# Patient Record
Sex: Female | Born: 1980 | Hispanic: Yes | Marital: Single | Smoking: Never smoker
Health system: Southern US, Community
[De-identification: ages and names within clinical notes are randomized; demographics above are authoritative.]

## PROBLEM LIST (undated history)

## (undated) DIAGNOSIS — Z789 Other specified health status: Secondary | ICD-10-CM

## (undated) HISTORY — PX: NO PAST SURGERIES: SHX2092

---

## 2008-09-23 ENCOUNTER — Inpatient Hospital Stay (HOSPITAL_COMMUNITY): Admission: AD | Admit: 2008-09-23 | Discharge: 2008-09-23 | Payer: Self-pay | Admitting: Family Medicine

## 2011-09-14 LAB — CBC
HCT: 39.5
Platelets: 318
WBC: 11.6 — ABNORMAL HIGH

## 2011-09-14 LAB — URINALYSIS, ROUTINE W REFLEX MICROSCOPIC
Bilirubin Urine: NEGATIVE
Glucose, UA: NEGATIVE
Ketones, ur: NEGATIVE
Nitrite: NEGATIVE
Urobilinogen, UA: 0.2
pH: 6.5

## 2011-09-14 LAB — URINE MICROSCOPIC-ADD ON

## 2011-09-14 LAB — WET PREP, GENITAL: Trich, Wet Prep: NONE SEEN

## 2011-09-14 LAB — URINE CULTURE

## 2011-09-14 LAB — GC/CHLAMYDIA PROBE AMP, GENITAL: GC Probe Amp, Genital: NEGATIVE

## 2015-09-23 ENCOUNTER — Inpatient Hospital Stay (HOSPITAL_COMMUNITY)
Admission: AD | Admit: 2015-09-23 | Discharge: 2015-09-23 | Disposition: A | Discharge status: Home / Self Care | Payer: Self-pay | Source: Ambulatory Visit | Attending: Family Medicine | Admitting: Family Medicine

## 2015-09-23 ENCOUNTER — Inpatient Hospital Stay (HOSPITAL_COMMUNITY): Payer: Self-pay

## 2015-09-23 ENCOUNTER — Encounter (HOSPITAL_COMMUNITY): Payer: Self-pay | Admitting: *Deleted

## 2015-09-23 DIAGNOSIS — O26891 Other specified pregnancy related conditions, first trimester: Secondary | ICD-10-CM | POA: Insufficient documentation

## 2015-09-23 DIAGNOSIS — O4691 Antepartum hemorrhage, unspecified, first trimester: Secondary | ICD-10-CM

## 2015-09-23 DIAGNOSIS — O209 Hemorrhage in early pregnancy, unspecified: Secondary | ICD-10-CM

## 2015-09-23 DIAGNOSIS — Z3A01 Less than 8 weeks gestation of pregnancy: Secondary | ICD-10-CM | POA: Insufficient documentation

## 2015-09-23 DIAGNOSIS — N939 Abnormal uterine and vaginal bleeding, unspecified: Secondary | ICD-10-CM | POA: Insufficient documentation

## 2015-09-23 HISTORY — DX: Other specified health status: Z78.9

## 2015-09-23 LAB — CBC
HEMATOCRIT: 43.3 % (ref 36.0–46.0)
Hemoglobin: 15.7 g/dL — ABNORMAL HIGH (ref 12.0–15.0)
MCH: 31.4 pg (ref 26.0–34.0)
MCHC: 36.3 g/dL — ABNORMAL HIGH (ref 30.0–36.0)
MCV: 86.6 fL (ref 78.0–100.0)
Platelets: 326 10*3/uL (ref 150–400)
RBC: 5 MIL/uL (ref 3.87–5.11)
RDW: 13.1 % (ref 11.5–15.5)
WBC: 12.4 10*3/uL — AB (ref 4.0–10.5)

## 2015-09-23 LAB — URINALYSIS, ROUTINE W REFLEX MICROSCOPIC
Bilirubin Urine: NEGATIVE
GLUCOSE, UA: NEGATIVE mg/dL
KETONES UR: NEGATIVE mg/dL
LEUKOCYTES UA: NEGATIVE
NITRITE: NEGATIVE
PROTEIN: NEGATIVE mg/dL
Specific Gravity, Urine: 1.01 (ref 1.005–1.030)
Urobilinogen, UA: 0.2 mg/dL (ref 0.0–1.0)
pH: 7 (ref 5.0–8.0)

## 2015-09-23 LAB — WET PREP, GENITAL
Clue Cells Wet Prep HPF POC: NONE SEEN
TRICH WET PREP: NONE SEEN
YEAST WET PREP: NONE SEEN

## 2015-09-23 LAB — URINE MICROSCOPIC-ADD ON

## 2015-09-23 LAB — HCG, QUANTITATIVE, PREGNANCY: hCG, Beta Chain, Quant, S: 281 m[IU]/mL — ABNORMAL HIGH (ref <5)

## 2015-09-23 LAB — POCT PREGNANCY, URINE: Preg Test, Ur: POSITIVE — AB

## 2015-09-23 NOTE — MAU Provider Note (Signed)
History     CSN: [id number]  Arrival date and time: 09/23/15 1721   First Provider Initiated Contact with Patient 09/23/15 1900      Chief Complaint  Patient presents with  . Vaginal Bleeding  . Possible Pregnancy   This is a 34 y.o. female at [redacted]w[redacted]d who presents with c/o bleeding in early pregnancy. Had first positive test in September. Started bleeding at 1545 today, heavy. States has a little cramping.    Vaginal Bleeding The patient's primary symptoms include vaginal bleeding. The patient's pertinent negatives include no genital itching, genital lesions or genital odor. This is a new problem. The current episode started today. The problem occurs intermittently. The pain is mild. She is pregnant. Associated symptoms include abdominal pain. Pertinent negatives include no chills, constipation, diarrhea, fever, headaches, nausea or vomiting. The vaginal discharge was bloody. The vaginal bleeding is lighter than menses. She has not been passing clots. She has not been passing tissue. Nothing aggravates the symptoms. She has tried nothing for the symptoms. No, her partner does not have an STD.  RN Note: Pos preg test in Sept and Oct, all were preg. Started bleeding at 1545, has soaked a pad  OB History    Gravida Para Term Preterm AB TAB SAB Ectopic Multiple Living   Past Medical History  Diagnosis Date  . Medical history non-contributory     Past Surgical History  Procedure Laterality Date  . No past surgeries      Family History  Problem Relation Age of Onset  . Heart disease Brother     Social History  Substance Use Topics  . Smoking status: Never Smoker   . Smokeless tobacco: None  . Alcohol Use: No    Allergies: No Known Allergies  Prescriptions prior to admission  Medication Sig Dispense Refill Last Dose  . Prenatal Vit-Fe Fumarate-FA (PRENATAL MULTIVITAMIN) TABS tablet Take 1 tablet by mouth daily at 12 noon.   09/23/2015 at Unknown time    Medical, Surgical, Family and Social histories reviewed and are listed above.  Medications and allergies reviewed.   Review of Systems  Constitutional: Negative for fever, chills and malaise/fatigue.  Gastrointestinal: Positive for abdominal pain. Negative for nausea, vomiting, diarrhea and constipation.  Genitourinary: Positive for vaginal bleeding.       Vaginal bleeding   Neurological: Negative for dizziness, weakness and headaches.  Other systems negative  Physical Exam   Blood pressure 135/85, pulse 91, temperature 98.1 F (36.7 C), temperature source Oral, resp. rate 18, height 4' 10.5" (1.486 m), weight 66.044 kg (145 lb 9.6 oz), last menstrual period 08/06/2015.  Physical Exam  Constitutional: She is oriented to person, place, and time. She appears well-developed and well-nourished. No distress.  HENT:  Head: Normocephalic.  Cardiovascular: Normal rate and regular rhythm.   Respiratory: Effort normal and breath sounds normal. No respiratory distress.  GI: Soft. She exhibits no distension and no mass. There is no tenderness. There is no rebound and no guarding.  Genitourinary: Vaginal discharge (small amount of blood) found.  Cervix long and closed Uterus small and firm No adnexal tenderness   Musculoskeletal: Normal range of motion.  Neurological: She is alert and oriented to person, place, and time.  Skin: Skin is warm and dry.  Psychiatric: She has a normal mood and affect.    MAU Course  Procedures  MDM This bleeding could represent a normal pregnancy with  bleeding, spontaneous abortion or even an ectopic which can be life-threatening.   Cultures were done to rule out pelvic infection Blood drawn for Quant HCG, CBC, ABO/Rh   Results for orders placed or performed during the hospital encounter of 09/23/15 (from the past 72 hour(s))  GC/Chlamydia probe amp ([place])not at [facility]     Status: None   Collection Time: 09/23/15 12:00 AM  Result Value Ref Range    Chlamydia Negative     Comment: Normal Reference Range - Negative   Neisseria gonorrhea Negative     Comment: Normal Reference Range - Negative  Urinalysis, Routine w reflex microscopic (not at [facility])     Status: Abnormal   Collection Time: 09/23/15  5:45 PM  Result Value Ref Range   Color, Urine YELLOW YELLOW   APPearance CLEAR CLEAR   Specific Gravity, Urine 1.010 1.005 - 1.030   pH 7.0 5.0 - 8.0   Glucose, UA NEGATIVE NEGATIVE mg/dL   Hgb urine dipstick LARGE (A) NEGATIVE   Bilirubin Urine NEGATIVE NEGATIVE   Ketones, ur NEGATIVE NEGATIVE mg/dL   Protein, ur NEGATIVE NEGATIVE mg/dL   Urobilinogen, UA 0.2 0.0 - 1.0 mg/dL   Nitrite NEGATIVE NEGATIVE   Leukocytes, UA NEGATIVE NEGATIVE  Urine microscopic-add on     Status: None   Collection Time: 09/23/15  5:45 PM  Result Value Ref Range   Squamous Epithelial / LPF RARE RARE   RBC / HPF 0-2 <3 RBC/hpf  Pregnancy, urine POC     Status: Abnormal   Collection Time: 09/23/15  5:55 PM  Result Value Ref Range   Preg Test, Ur POSITIVE (A) NEGATIVE    Comment:        THE SENSITIVITY OF THIS METHODOLOGY IS >24 mIU/mL   hCG, quantitative, pregnancy     Status: Abnormal   Collection Time: 09/23/15  6:16 PM  Result Value Ref Range   hCG, Beta Chain, Quant, S 281 (H) <5 mIU/mL    Comment:          GEST. AGE      CONC.  (mIU/mL)   <=1 WEEK        5 - 50     2 WEEKS       50 - 500     3 WEEKS       100 - 10,000     4 WEEKS     1,000 - 30,000     5 WEEKS     3,500 - 115,000   6-8 WEEKS     12,000 - 270,000    12 WEEKS     15,000 - 220,000        FEMALE AND NON-PREGNANT FEMALE:     LESS THAN 5 mIU/mL   CBC     Status: Abnormal   Collection Time: 09/23/15  6:16 PM  Result Value Ref Range   WBC 12.4 (H) 4.0 - 10.5 K/uL   RBC 5.00 3.87 - 5.11 MIL/uL   Hemoglobin 15.7 (H) 12.0 - 15.0 g/dL   HCT 16.1 09.6 - 04.5 %   MCV 86.6 78.0 - 100.0 fL   MCH 31.4 26.0 - 34.0 pg   MCHC 36.3 (H) 30.0 - 36.0 g/dL   RDW 40.9 81.1 - 91.4 %    Platelets 326 150 - 400 K/uL  HIV antibody (routine testing) (NOT for [facility])     Status: None   Collection Time: 09/23/15  6:16 PM  Result Value Ref Range   HIV  Screen 4th Generation wRfx Non Reactive Non Reactive    Comment: (NOTE) Performed At: [facility] [address] [place], [place] [id number] Mila Homer MD WU:[id number]   Wet prep, genital     Status: Abnormal   Collection Time: 09/23/15  7:20 PM  Result Value Ref Range   Yeast Wet Prep HPF POC NONE SEEN NONE SEEN   Trich, Wet Prep NONE SEEN NONE SEEN   Clue Cells Wet Prep HPF POC NONE SEEN NONE SEEN   WBC, Wet Prep HPF POC MODERATE (A) NONE SEEN    Comment: FEW BACTERIA SEEN   US showed small sac, possibly gestational with no yolk sac or fetal pole. Cannot rule out ectopic yet  Assessment and Plan  A:  Pregnancy at [redacted]w[redacted]d       Pregnancy of unknown location       Cannot rule out ectopic vs first trimester bleeding vs SAB  P:  Discussed findings and results       Recommend return in 48 hours to check HCG level      Ectopic precautions      Bleeding precautions  Mary Stevenson 09/23/2015, 7:00 PM

## 2015-09-23 NOTE — Discharge Instructions (Signed)
Hemorragia vaginal durante el embarazo (primer trimestre)  (Vaginal Bleeding During Pregnancy, First Trimester)  Durante los primeros meses de embarazo, es común tener una pequeña hemorragia vaginal (manchas). A veces, la hemorragia es normal y no representa un problema, pero en algunas ocasiones es un síntoma de algo grave. Asegúrese de decirle a su médico de inmediato si tiene algún tipo de hemorragia vaginal.  CUIDADOS EN EL HOGAR  · Controle su afección para ver si hay cambios.  · Siga las indicaciones de su médico con respecto al grado de actividad que puede tener.  · Si debe hacer reposo en cama:    Es posible que deba quedarse en cama y levantarse únicamente para ir al baño.    Quizás le permitan hacer algunas actividades.    Si es necesario, planifique que alguien la ayude.  · Escriba:    La cantidad de toallas higiénicas que usa cada día.    La frecuencia con la que se cambia las toallas higiénicas.    Indique que tan empapados (saturados) están.  · No use tampones.  · No se haga duchas vaginales.  · No tenga relaciones sexuales ni orgasmos hasta que el médico la autorice.  · Si elimina tejido por la vagina, guárdelo para mostrárselo al médico.  · Tome los medicamentos solamente como se lo haya indicado el médico.  · No tome aspirina, ya que puede causar hemorragias.  · Concurra a todas las visitas de control como se lo haya indicado el médico.  SOLICITE AYUDA SI:   · Tiene una hemorragia vaginal.  · Tiene cólicos.  · Tiene dolores de parto.  · Tiene fiebre que no desaparece después de tomar medicamentos.  SOLICITE AYUDA DE INMEDIATO SI:   · Siente cólicos muy intensos en la espalda o en el vientre (abdomen).  · Elimina coágulos grandes o tejido por la vagina.  · Tiene más hemorragia.  · Se siente débil o que va a desvanecerse.  · Pierde el conocimiento (se desmaya).  · Tiene escalofríos.  · Tiene una pérdida importante o sale líquido a borbotones por la vagina.  · Se desmaya mientras defeca.  ASEGÚRESE DE  QUE:  · Comprende estas instrucciones.  · Controlará su afección.  · Recibirá ayuda de inmediato si no mejora o si empeora.     Esta información no tiene como fin reemplazar el consejo del médico. Asegúrese de hacerle al médico cualquier pregunta que tenga.     Document Released: 04/15/2014  Elsevier Interactive Patient Education ©2016 Elsevier Inc.

## 2015-09-23 NOTE — MAU Note (Signed)
Pos preg test in Sept and Oct, all were preg. Started bleeding at 1545, has soaked a pad.

## 2015-09-24 ENCOUNTER — Inpatient Hospital Stay (HOSPITAL_COMMUNITY)
Admission: AD | Admit: 2015-09-24 | Discharge: 2015-09-24 | Disposition: A | Discharge status: Home / Self Care | Payer: Self-pay | Source: Ambulatory Visit | Attending: Obstetrics and Gynecology | Admitting: Obstetrics and Gynecology

## 2015-09-24 ENCOUNTER — Inpatient Hospital Stay (HOSPITAL_COMMUNITY): Payer: Self-pay

## 2015-09-24 ENCOUNTER — Encounter (HOSPITAL_COMMUNITY): Payer: Self-pay | Admitting: *Deleted

## 2015-09-24 DIAGNOSIS — Z3A01 Less than 8 weeks gestation of pregnancy: Secondary | ICD-10-CM | POA: Insufficient documentation

## 2015-09-24 DIAGNOSIS — O209 Hemorrhage in early pregnancy, unspecified: Secondary | ICD-10-CM

## 2015-09-24 DIAGNOSIS — O039 Complete or unspecified spontaneous abortion without complication: Secondary | ICD-10-CM | POA: Insufficient documentation

## 2015-09-24 DIAGNOSIS — O4691 Antepartum hemorrhage, unspecified, first trimester: Secondary | ICD-10-CM | POA: Insufficient documentation

## 2015-09-24 LAB — CBC
HCT: 42.2 % (ref 36.0–46.0)
Hemoglobin: 15.3 g/dL — ABNORMAL HIGH (ref 12.0–15.0)
MCH: 31.6 pg (ref 26.0–34.0)
MCHC: 36.3 g/dL — AB (ref 30.0–36.0)
MCV: 87.2 fL (ref 78.0–100.0)
PLATELETS: 278 10*3/uL (ref 150–400)
RBC: 4.84 MIL/uL (ref 3.87–5.11)
RDW: 13.1 % (ref 11.5–15.5)
WBC: 10.5 10*3/uL (ref 4.0–10.5)

## 2015-09-24 LAB — GC/CHLAMYDIA PROBE AMP (~~LOC~~) NOT AT ARMC
Chlamydia: NEGATIVE
NEISSERIA GONORRHEA: NEGATIVE

## 2015-09-24 LAB — HCG, QUANTITATIVE, PREGNANCY: HCG, BETA CHAIN, QUANT, S: 146 m[IU]/mL — AB (ref <5)

## 2015-09-24 LAB — ABO/RH: ABO/RH(D): O POS

## 2015-09-24 LAB — HIV ANTIBODY (ROUTINE TESTING W REFLEX): HIV SCREEN 4TH GENERATION: NONREACTIVE

## 2015-09-24 MED ORDER — HYDROCODONE-ACETAMINOPHEN 5-325 MG PO TABS
1.0000 | ORAL_TABLET | Freq: Once | ORAL | Status: AC
  Administered 2015-09-24: 1 via ORAL
  Filled 2015-09-24: qty 1

## 2015-09-24 NOTE — MAU Note (Signed)
Pt seen in MAU yesterday for bleeding,  Bleeding is much heavier this morning, states she is changing a pad every 5 minutes.  Also lower abd & lower back pain.

## 2015-09-24 NOTE — MAU Provider Note (Signed)
History     CSN: [id number]  Arrival date and time: 09/24/15 4098   First Provider Initiated Contact with Patient 09/24/15 [phone]      Chief Complaint  Patient presents with  . Vaginal Bleeding  . Abdominal Pain   HPI Mary Stevenson 34 y.o. G4P3000  presents for increased vaginal blleding.  It started yesterday and overnight became much heavier.  She was not doing anything to aggravate it.  She has noted no alleviating factors.  She has associated abdominal pain.  She has not taken any medications.  She notes pain is 9/10.  She had 2 big clots overnight and one smaller one this am.  She has a headache.  Denies weakness, syncope, fever, CP, SOB, nausea, vomiting.   OB History    Gravida Para Term Preterm AB TAB SAB Ectopic Multiple Living   Past Medical History  Diagnosis Date  . Medical history non-contributory     Past Surgical History  Procedure Laterality Date  . No past surgeries      Family History  Problem Relation Age of Onset  . Heart disease Brother     Social History  Substance Use Topics  . Smoking status: Never Smoker   . Smokeless tobacco: None  . Alcohol Use: No    Allergies: No Known Allergies  Prescriptions prior to admission  Medication Sig Dispense Refill Last Dose  . Prenatal Vit-Fe Fumarate-FA (PRENATAL MULTIVITAMIN) TABS tablet Take 1 tablet by mouth daily at 12 noon.   09/23/2015 at Unknown time    ROS Pertinent ROS in HPI.  All other systems are negative.   Physical Exam   Blood pressure 125/83, pulse 92, temperature 98.2 F (36.8 C), temperature source Oral, resp. rate 18, last menstrual period 08/06/2015.  Physical Exam  Constitutional: She is oriented to person, place, and time. She appears well-developed and well-nourished. No distress.  HENT:  Head: Normocephalic and atraumatic.  Eyes: EOM are normal.  Neck: Normal range of motion.  Cardiovascular: Normal rate and normal heart sounds.   Respiratory:  Breath sounds normal. No respiratory distress.  GI: Soft. She exhibits no distension. There is no tenderness.  Genitourinary:  Mod amt of dark red blood in vault.  Small amt of products present in os.  Removed and sent for path.   Cervix open finger tip.  No tenderness appreciated  Musculoskeletal: Normal range of motion.  Neurological: She is alert and oriented to person, place, and time.  Skin: Skin is warm and dry.  Psychiatric: She has a normal mood and affect.   Results for orders placed or performed during the hospital encounter of 09/24/15 (from the past 24 hour(s))  ABO/Rh     Status: None   Collection Time: 09/24/15  8:04 AM  Result Value Ref Range   ABO/RH(D) O POS   CBC     Status: Abnormal   Collection Time: 09/24/15  8:05 AM  Result Value Ref Range   WBC 10.5 4.0 - 10.5 K/uL   RBC 4.84 3.87 - 5.11 MIL/uL   Hemoglobin 15.3 (H) 12.0 - 15.0 g/dL   HCT 47.8 29.5 - 62.1 %   MCV 87.2 78.0 - 100.0 fL   MCH 31.6 26.0 - 34.0 pg   MCHC 36.3 (H) 30.0 - 36.0 g/dL   RDW 30.8 65.7 - 84.6 %   Platelets 278 150 - 400 K/uL  hCG, quantitative, pregnancy  Status: Abnormal   Collection Time: 09/24/15  8:05 AM  Result Value Ref Range   hCG, Beta Chain, Quant, S 146 (H) <5 mIU/mL   [place] Ob Comp Less 14 Wks  09/23/2015  CLINICAL DATA:  34 year old pregnant female with vaginal bleeding. Estimated gestational age of [redacted] weeks 6 days by LMP. Beta HCG of 281. EXAM: OBSTETRIC <14 WK US AND TRANSVAGINAL OB US TECHNIQUE: Both transabdominal and transvaginal ultrasound examinations were performed for complete evaluation of the gestation as well as the maternal uterus, adnexal regions, and pelvic cul-de-sac. Transvaginal technique was performed to assess early pregnancy. COMPARISON:  None. FINDINGS: Intrauterine gestational sac: A small cystic structure within the uterus is identified with mean diameter of 2.9 mm. This may represent an early gestational sac. Yolk sac:  Not visualized Embryo:  Not  visualized Cardiac Activity: Not visualized MSD: 2.9  mm   5 w   0  d                US EDC: 05/25/2016 Maternal uterus/adnexae: There is no evidence of subchorionic hemorrhage. The ovaries bilaterally are unremarkable. There is no evidence of free fluid or adnexal mass. IMPRESSION: Probable early intrauterine gestational sac, but no yolk sac, fetal pole, or cardiac activity yet visualized. Estimated gestational age of [redacted] weeks 0 days if this represents an early intrauterine gestational sac. Recommend follow-up quantitative B-HCG levels and follow-up US in 14 days to confirm and assess viability. This recommendation follows SRU consensus guidelines: Diagnostic Criteria for Nonviable Pregnancy Early in the First Trimester. Malva Limes Engl J Med 2013; 914:7829-56; 369:1443-51. Electronically Signed   By: Harmon PierJeffrey  Hu M.D.   On: 09/23/2015 19:56   [place] Ob Transvaginal  09/24/2015  CLINICAL DATA:  Patient with increased lower back pain and vaginal bleeding. EXAM: TRANSVAGINAL OB ULTRASOUND TECHNIQUE: Transvaginal ultrasound was performed for complete evaluation of the gestation as well as the maternal uterus, adnexal regions, and pelvic cul-de-sac. COMPARISON:  Pelvic ultrasound 09/23/2015 FINDINGS: Intrauterine gestational sac: Not present Yolk sac:  Not present Embryo:  Not present Cardiac Activity: Not present Maternal uterus/adnexae: Normal right and left ovaries. No subchorionic hemorrhage. No free fluid in the pelvis. IMPRESSION: No intrauterine gestation identified. As there was suggestion of possible gestational sac versus pseudo-gestational sac on imaging 1 day prior, this may represent interval spontaneous abortion; however, in the setting of positive pregnancy test and no definite intrauterine pregnancy, this reflects a pregnancy of unknown location. Differential considerations include early normal IUP, abnormal IUP, or nonvisualized ectopic pregnancy. Differentiation is achieved with serial beta HCG supplemented by repeat  sonography as clinically warranted. Electronically Signed   By: Annia Beltrew  Davis M.D.   On: 09/24/2015 09:55   [place] Ob Transvaginal  09/23/2015  CLINICAL DATA:  34 year old pregnant female with vaginal bleeding. Estimated gestational age of [redacted] weeks 6 days by LMP. Beta HCG of 281. EXAM: OBSTETRIC <14 WK US AND TRANSVAGINAL OB US TECHNIQUE: Both transabdominal and transvaginal ultrasound examinations were performed for complete evaluation of the gestation as well as the maternal uterus, adnexal regions, and pelvic cul-de-sac. Transvaginal technique was performed to assess early pregnancy. COMPARISON:  None. FINDINGS: Intrauterine gestational sac: A small cystic structure within the uterus is identified with mean diameter of 2.9 mm. This may represent an early gestational sac. Yolk sac:  Not visualized Embryo:  Not visualized Cardiac Activity: Not visualized MSD: 2.9  mm   5 w   0  d  [place] EDC: 05/25/2016 Maternal uterus/adnexae: There is no evidence of subchorionic hemorrhage. The ovaries bilaterally are unremarkable. There is no evidence of free fluid or adnexal mass. IMPRESSION: Probable early intrauterine gestational sac, but no yolk sac, fetal pole, or cardiac activity yet visualized. Estimated gestational age of [redacted] weeks 0 days if this represents an early intrauterine gestational sac. Recommend follow-up quantitative B-HCG levels and follow-up US in 14 days to confirm and assess viability. This recommendation follows SRU consensus guidelines: Diagnostic Criteria for Nonviable Pregnancy Early in the First Trimester. Malva Limes Med 2013; [id number]:1191-47. Electronically Signed   By: Harmon Pier M.D.   On: 09/23/2015 19:56   MAU Course  Procedures  MDM U/S from yesterday with gestational sac - now no longer present.  HCG levels have also dropped.    Assessment and Plan  A:  1. SAB (spontaneous abortion)   2. Vaginal bleeding in pregnancy, first trimester    P: Discharge to home OTC ibuprofen for  discomfort Continue PNV Follow up with HD If any worsening of sxs: weakness, heavy bleeding, severe pain, fever - return to MAU right away.   Patient may return to MAU as needed or if her condition were to change or worsen    Bertram Denver 09/24/2015, 8:33 AM

## 2015-09-24 NOTE — Discharge Instructions (Signed)
Aborto espontáneo  °(Miscarriage) °El aborto espontáneo es la pérdida de un bebé que no ha nacido (feto) antes de la semana 20 del embarazo. La mayor parte de estos abortos ocurre en los primeros 3 meses. En algunos casos ocurre antes de que la mujer sepa que está embarazada. También se denomina "aborto espontáneo" o "pérdida prematura del embarazo". El aborto espontáneo puede ser una experiencia que afecte emocionalmente a la persona. Converse con su médico si tiene dudas, cómo es el proceso de duelo, y sobre planes futuros de embarazo.  °CAUSAS  °· Algunos problemas cromosómicos pueden hacer imposible que el bebé se desarrolle normalmente. Los problemas con los genes o cromosomas del bebé son generalmente el resultado de errores que se producen, por casualidad, cuando el embrión se divide y crece. Estos problemas no se heredan de los padres. °· Infección en el cuello del útero.   °· Problemas hormonales.   °· Problemas en el cuello del útero, como tener un útero incompetente. Esto ocurre cuando los tejidos no son lo suficientemente fuertes como para contener el embarazo.   °· Problemas del útero, como un útero con forma anormal, los fibromas o anormalidades congénitas.   °· Ciertas enfermedades crónicas.   °· No fume, no beba alcohol, ni consuma drogas.   °· Traumatismos   °A veces, la causa es desconocida.  °SÍNTOMAS  °· Sangrado o manchado vaginal, con o sin cólicos o dolor. °· Dolor o cólicos en el abdomen o en la cintura. °· Eliminación de líquido, tejidos o coágulos grandes por la vagina. °DIAGNÓSTICO  °El médico le hará un examen físico. También le indicará una ecografía para confirmar el aborto. Es posible que se realicen análisis de sangre.  °TRATAMIENTO  °· En algunos casos el tratamiento no es necesario, si se eliminan naturalmente todos los tejidos embrionarios que se encontraban en el útero. Si el feto o la placenta quedan dentro del útero (aborto incompleto), pueden infectarse, los tejidos que quedan  pueden infectarse y deben retirarse. Generalmente se realiza un procedimiento de dilatación y curetaje (D y C). Durante el procedimiento de dilatación y curetaje, el cuello del útero se abre (dilata) y se retira cualquier resto de tejido fetal o placentario del útero. °· Si hay una infección, le recetarán antibióticos. Podrán recetarle otros medicamentos para reducir el tamaño del útero (contraerlo) si hay una mucho sangrado. °· Si su sangre es Rh negativa y su bebé es Rh positivo, usted necesitará la inyección de inmunoglobulina Rh. Esta inyección protegerá a los futuros bebés de tener problemas de compatibilidad Rh en futuros embarazos. °INSTRUCCIONES PARA EL CUIDADO EN EL HOGAR  °· El médico le indicará reposo en cama o le permitirá realizar actividades livianas. Vuelva a la actividad lentamente o según las indicaciones de su médico. °· Pídale a alguien que la ayude con las responsabilidades familiares y del hogar durante este tiempo.   °· Lleve un registro de la cantidad y la saturación de las toallas higiénicas que utiliza cada día. Anote esta información   °· No use tampones. No No se haga duchas vaginales ni tenga relaciones sexuales hasta que el médico la autorice.   °· Sólo tome medicamentos de venta libre o recetados para calmar el dolor o el malestar, según las indicaciones de su médico.   °· No tome aspirina. La aspirina puede ocasionar hemorragias.   °· Concurra puntualmente a las citas de control con el médico.   °· Si usted o su pareja tienen dificultades con el duelo, hable con su médico para buscar la ayuda psicológica que los ayude a enfrentar la pérdida   del embarazo. Permtase el tiempo suficiente de duelo antes de quedar embarazada nuevamente.  SOLICITE ATENCIN MDICA DE INMEDIATO SI:   Siente calambres intensos o dolor en la espalda o en el abdomen.  Tiene fiebre.  Elimina grandes cogulos de [place] (del tamao de una nuez o ms) o tejidos por la vagina. Guarde lo que ha eliminado para  que su mdico lo examine.   La hemorragia aumenta.   Brett Fairybserva una secrecin vaginal espesa y con mal olor.  Se siente mareada, dbil, o se desmaya.   Siente escalofros.  ASEGRESE DE QUE:   Comprende estas instrucciones.  Controlar su enfermedad.  Solicitar ayuda de inmediato si no mejora o si empeora.   Esta informacin no tiene Theme park managercomo fin reemplazar el consejo del mdico. Asegrese de hacerle al mdico cualquier pregunta que tenga.   Document Released: 09/08/2005 Document Revised: 03/26/2013 Elsevier Interactive Patient Education 2016 [employer]  Reposo plvico  (Pelvic Rest) El reposo plvico se recomienda a las mujeres cuando:   La placenta cubre parcial o completamente la abertura del cuello del tero (placenta previa).  Hay sangrado entre la pared del tero y el saco amnitico en el primer trimestre (hemorragia subcorinica).  El cuello uterino comienza a abrirse sin iniciarse el trabajo de parto (cuello uterino incompetente, insuficiencia cervical).  El [place] part[place] se inicia muy pronto (parto prematuro). INSTRUCCIONES PARA EL CUIDADO EN EL HOGAR   No tenga relaciones sexuales, estimulacin, ni orgasmos.  No use tampones, no se haga duchas vaginales ni coloque ningn objeto en la vagina.  No levante objetos que pesen ms de 10 libras (4,5 kg).  Evite las actividades extenuantes o tensionar los msculos de la pelvis. SOLICITE ATENCIN MDICA SI:   Tiene un sangrado vaginal durante el embarazo. Considrelo como una posible emergencia.  Siente clicos en la zona baja del estmago (ms fuertes que los clicos menstruales).  Nota flujo vaginal (acuoso, con moco o [place]).  Siente un dolor en la espalda leve y sordo.  Tiene contracciones regulares o endurecimiento del tero. SOLICITE ATENCIN MDICA DE INMEDIATO SI:  Observa sangrado vaginal y tiene placenta previa.    Esta informacin no tiene Theme park managercomo fin reemplazar el consejo del mdico. Asegrese  de hacerle al mdico cualquier pregunta que tenga.   Document Released: 08/23/2012 Elsevier Interactive Patient Education [employer]2016 Elsevier Inc.

## 2016-07-28 ENCOUNTER — Encounter (HOSPITAL_COMMUNITY): Payer: Self-pay

## 2021-06-01 ENCOUNTER — Emergency Department (HOSPITAL_COMMUNITY): Payer: Self-pay | Admitting: Anesthesiology

## 2021-06-01 ENCOUNTER — Emergency Department (HOSPITAL_COMMUNITY): Payer: Self-pay

## 2021-06-01 ENCOUNTER — Observation Stay (HOSPITAL_COMMUNITY)
Admission: EM | Admit: 2021-06-01 | Discharge: 2021-06-02 | Disposition: A | Discharge status: Home / Self Care | Payer: Self-pay | Attending: General Surgery | Admitting: General Surgery

## 2021-06-01 ENCOUNTER — Encounter (HOSPITAL_COMMUNITY): Payer: Self-pay

## 2021-06-01 ENCOUNTER — Encounter (HOSPITAL_COMMUNITY)
Admission: EM | Disposition: A | Discharge status: Home / Self Care | Payer: Self-pay | Source: Home / Self Care | Attending: Emergency Medicine

## 2021-06-01 ENCOUNTER — Other Ambulatory Visit: Payer: Self-pay

## 2021-06-01 DIAGNOSIS — R1084 Generalized abdominal pain: Secondary | ICD-10-CM

## 2021-06-01 DIAGNOSIS — K3580 Unspecified acute appendicitis: Principal | ICD-10-CM | POA: Diagnosis present

## 2021-06-01 DIAGNOSIS — Z20822 Contact with and (suspected) exposure to covid-19: Secondary | ICD-10-CM | POA: Insufficient documentation

## 2021-06-01 DIAGNOSIS — K353 Acute appendicitis with localized peritonitis, without perforation or gangrene: Secondary | ICD-10-CM

## 2021-06-01 HISTORY — PX: LAPAROSCOPIC APPENDECTOMY: SHX408

## 2021-06-01 LAB — COMPREHENSIVE METABOLIC PANEL
ALT: 20 U/L (ref 0–44)
AST: 22 U/L (ref 15–41)
Albumin: 3.9 g/dL (ref 3.5–5.0)
Alkaline Phosphatase: 69 U/L (ref 38–126)
Anion gap: 7 (ref 5–15)
BUN: 10 mg/dL (ref 6–20)
CO2: 27 mmol/L (ref 22–32)
Calcium: 9 mg/dL (ref 8.9–10.3)
Chloride: 102 mmol/L (ref 98–111)
Creatinine, Ser: 0.62 mg/dL (ref 0.44–1.00)
GFR, Estimated: >60 mL/min (ref >60)
Glucose, Bld: 145 mg/dL — ABNORMAL HIGH (ref 70–99)
Potassium: 4.1 mmol/L (ref 3.5–5.1)
Sodium: 136 mmol/L (ref 135–145)
Total Bilirubin: 0.5 mg/dL (ref 0.3–1.2)
Total Protein: 8.1 g/dL (ref 6.5–8.1)

## 2021-06-01 LAB — RESP PANEL BY RT-PCR (FLU A&B, COVID) ARPGX2
Influenza A by PCR: NEGATIVE
Influenza B by PCR: NEGATIVE
SARS Coronavirus 2 by RT PCR: NEGATIVE

## 2021-06-01 LAB — CBC
HCT: 40.3 % (ref 36.0–46.0)
Hemoglobin: 14.4 g/dL (ref 12.0–15.0)
MCH: 30.6 pg (ref 26.0–34.0)
MCHC: 35.7 g/dL (ref 30.0–36.0)
MCV: 85.6 fL (ref 80.0–100.0)
Platelets: 367 10*3/uL (ref 150–400)
RBC: 4.71 MIL/uL (ref 3.87–5.11)
RDW: 13.2 % (ref 11.5–15.5)
WBC: 13.4 10*3/uL — ABNORMAL HIGH (ref 4.0–10.5)
nRBC: 0 % (ref 0.0–0.2)

## 2021-06-01 LAB — I-STAT BETA HCG BLOOD, ED (MC, WL, AP ONLY): I-stat hCG, quantitative: <5 m[IU]/mL (ref <5)

## 2021-06-01 LAB — LIPASE, BLOOD: Lipase: 25 U/L (ref 11–51)

## 2021-06-01 SURGERY — APPENDECTOMY, LAPAROSCOPIC
Anesthesia: General

## 2021-06-01 MED ORDER — HYDROMORPHONE HCL 1 MG/ML IJ SOLN
0.5000 mg | Freq: Once | INTRAMUSCULAR | Status: AC
  Administered 2021-06-01: 0.5 mg via INTRAVENOUS
  Filled 2021-06-01: qty 1

## 2021-06-01 MED ORDER — KETOROLAC TROMETHAMINE 30 MG/ML IJ SOLN
INTRAMUSCULAR | Status: DC | PRN
  Administered 2021-06-01: 30 mg via INTRAVENOUS

## 2021-06-01 MED ORDER — LIDOCAINE 2% (20 MG/ML) 5 ML SYRINGE
INTRAMUSCULAR | Status: AC
  Filled 2021-06-01: qty 5

## 2021-06-01 MED ORDER — OXYCODONE HCL 5 MG PO TABS: 5.0000 mg | ORAL_TABLET | Freq: Four times a day (QID) | ORAL | 0 refills | Status: AC | PRN

## 2021-06-01 MED ORDER — DIPHENHYDRAMINE HCL 25 MG PO CAPS: 25.0000 mg | ORAL_CAPSULE | Freq: Four times a day (QID) | ORAL | Status: DC | PRN

## 2021-06-01 MED ORDER — SODIUM CHLORIDE 0.9 % IV SOLN
2.0000 g | Freq: Once | INTRAVENOUS | Status: AC
  Administered 2021-06-01: 2 g via INTRAVENOUS
  Filled 2021-06-01: qty 20

## 2021-06-01 MED ORDER — FENTANYL CITRATE (PF) 100 MCG/2ML IJ SOLN
25.0000 ug | Freq: Once | INTRAMUSCULAR | Status: AC
  Administered 2021-06-01: 25 ug via INTRAVENOUS
  Filled 2021-06-01: qty 2

## 2021-06-01 MED ORDER — OXYCODONE HCL 5 MG PO TABS: 5.0000 mg | ORAL_TABLET | Freq: Once | ORAL | Status: DC | PRN

## 2021-06-01 MED ORDER — ACETAMINOPHEN 500 MG PO TABS
1000.0000 mg | ORAL_TABLET | ORAL | Status: AC
  Administered 2021-06-01: 1000 mg via ORAL
  Filled 2021-06-01: qty 2

## 2021-06-01 MED ORDER — HYDRALAZINE HCL 20 MG/ML IJ SOLN: 10.0000 mg | INTRAMUSCULAR | Status: DC | PRN

## 2021-06-01 MED ORDER — ACETAMINOPHEN 325 MG PO TABS: 325.0000 mg | ORAL_TABLET | ORAL | Status: DC | PRN

## 2021-06-01 MED ORDER — LIDOCAINE 2% (20 MG/ML) 5 ML SYRINGE
INTRAMUSCULAR | Status: DC | PRN
  Administered 2021-06-01: 80 mg via INTRAVENOUS

## 2021-06-01 MED ORDER — DEXAMETHASONE SODIUM PHOSPHATE 10 MG/ML IJ SOLN
INTRAMUSCULAR | Status: AC
  Filled 2021-06-01: qty 1

## 2021-06-01 MED ORDER — ACETAMINOPHEN 500 MG PO TABS
1000.0000 mg | ORAL_TABLET | Freq: Four times a day (QID) | ORAL | Status: DC
  Administered 2021-06-01 – 2021-06-02 (×2): 1000 mg via ORAL
  Filled 2021-06-01 (×3): qty 2

## 2021-06-01 MED ORDER — BUPIVACAINE-EPINEPHRINE (PF) 0.25% -1:200000 IJ SOLN
INTRAMUSCULAR | Status: AC
  Filled 2021-06-01: qty 30

## 2021-06-01 MED ORDER — SIMETHICONE 80 MG PO CHEW: 40.0000 mg | CHEWABLE_TABLET | Freq: Four times a day (QID) | ORAL | Status: DC | PRN

## 2021-06-01 MED ORDER — FENTANYL CITRATE (PF) 100 MCG/2ML IJ SOLN
INTRAMUSCULAR | Status: AC
  Filled 2021-06-01: qty 2

## 2021-06-01 MED ORDER — ONDANSETRON HCL 4 MG/2ML IJ SOLN: 4.0000 mg | Freq: Once | INTRAMUSCULAR | Status: DC | PRN

## 2021-06-01 MED ORDER — DEXAMETHASONE SODIUM PHOSPHATE 10 MG/ML IJ SOLN
INTRAMUSCULAR | Status: DC | PRN
  Administered 2021-06-01: 10 mg via INTRAVENOUS

## 2021-06-01 MED ORDER — FENTANYL CITRATE (PF) 100 MCG/2ML IJ SOLN: 25.0000 ug | INTRAMUSCULAR | Status: DC | PRN

## 2021-06-01 MED ORDER — MIDAZOLAM HCL 2 MG/2ML IJ SOLN
INTRAMUSCULAR | Status: AC
  Filled 2021-06-01: qty 2

## 2021-06-01 MED ORDER — ROCURONIUM BROMIDE 10 MG/ML (PF) SYRINGE
PREFILLED_SYRINGE | INTRAVENOUS | Status: DC | PRN
  Administered 2021-06-01: 60 mg via INTRAVENOUS

## 2021-06-01 MED ORDER — MORPHINE SULFATE (PF) 2 MG/ML IV SOLN: 2.0000 mg | INTRAVENOUS | Status: DC | PRN

## 2021-06-01 MED ORDER — SODIUM CHLORIDE (PF) 0.9 % IJ SOLN
INTRAMUSCULAR | Status: AC
  Filled 2021-06-01: qty 50

## 2021-06-01 MED ORDER — DOCUSATE SODIUM 100 MG PO CAPS
100.0000 mg | ORAL_CAPSULE | Freq: Two times a day (BID) | ORAL | Status: DC
  Administered 2021-06-01 – 2021-06-02 (×2): 100 mg via ORAL
  Filled 2021-06-01 (×2): qty 1

## 2021-06-01 MED ORDER — ONDANSETRON HCL 4 MG/2ML IJ SOLN
INTRAMUSCULAR | Status: DC | PRN
  Administered 2021-06-01: 4 mg via INTRAVENOUS

## 2021-06-01 MED ORDER — DIPHENHYDRAMINE HCL 50 MG/ML IJ SOLN: 25.0000 mg | Freq: Four times a day (QID) | INTRAMUSCULAR | Status: DC | PRN

## 2021-06-01 MED ORDER — ONDANSETRON HCL 4 MG/2ML IJ SOLN: 4.0000 mg | Freq: Four times a day (QID) | INTRAMUSCULAR | Status: DC | PRN

## 2021-06-01 MED ORDER — ONDANSETRON HCL 4 MG/2ML IJ SOLN
INTRAMUSCULAR | Status: AC
  Filled 2021-06-01: qty 2

## 2021-06-01 MED ORDER — FENTANYL CITRATE (PF) 100 MCG/2ML IJ SOLN
INTRAMUSCULAR | Status: DC | PRN
  Administered 2021-06-01 (×2): 50 ug via INTRAVENOUS

## 2021-06-01 MED ORDER — METRONIDAZOLE 500 MG/100ML IV SOLN
500.0000 mg | Freq: Once | INTRAVENOUS | Status: AC
  Administered 2021-06-01: 500 mg via INTRAVENOUS
  Filled 2021-06-01: qty 100

## 2021-06-01 MED ORDER — HYDROMORPHONE HCL 1 MG/ML IJ SOLN
0.5000 mg | Freq: Once | INTRAMUSCULAR | Status: DC
  Filled 2021-06-01: qty 1

## 2021-06-01 MED ORDER — SODIUM CHLORIDE 0.9 % IV SOLN: INTRAVENOUS | Status: DC

## 2021-06-01 MED ORDER — BUPIVACAINE-EPINEPHRINE 0.25% -1:200000 IJ SOLN
INTRAMUSCULAR | Status: DC | PRN
  Administered 2021-06-01: 30 mL

## 2021-06-01 MED ORDER — MIDAZOLAM HCL 5 MG/5ML IJ SOLN
INTRAMUSCULAR | Status: DC | PRN
  Administered 2021-06-01: 2 mg via INTRAVENOUS

## 2021-06-01 MED ORDER — ENOXAPARIN SODIUM 40 MG/0.4ML IJ SOSY
40.0000 mg | PREFILLED_SYRINGE | INTRAMUSCULAR | Status: DC
  Administered 2021-06-02: 40 mg via SUBCUTANEOUS
  Filled 2021-06-01: qty 0.4

## 2021-06-01 MED ORDER — MORPHINE SULFATE (PF) 4 MG/ML IV SOLN
4.0000 mg | Freq: Once | INTRAVENOUS | Status: AC
  Administered 2021-06-01: 4 mg via INTRAVENOUS
  Filled 2021-06-01: qty 1

## 2021-06-01 MED ORDER — ACETAMINOPHEN 160 MG/5ML PO SOLN: 325.0000 mg | ORAL | Status: DC | PRN

## 2021-06-01 MED ORDER — METHOCARBAMOL 500 MG PO TABS: 500.0000 mg | ORAL_TABLET | Freq: Four times a day (QID) | ORAL | Status: DC | PRN

## 2021-06-01 MED ORDER — OXYCODONE HCL 5 MG PO TABS
5.0000 mg | ORAL_TABLET | ORAL | Status: DC | PRN
  Administered 2021-06-01: 10 mg via ORAL
  Administered 2021-06-02: 5 mg via ORAL
  Administered 2021-06-02: 10 mg via ORAL
  Filled 2021-06-01: qty 2
  Filled 2021-06-01: qty 1
  Filled 2021-06-01: qty 2

## 2021-06-01 MED ORDER — LACTATED RINGERS IV SOLN: INTRAVENOUS | Status: DC

## 2021-06-01 MED ORDER — PROPOFOL 10 MG/ML IV BOLUS
INTRAVENOUS | Status: DC | PRN
  Administered 2021-06-01: 140 mg via INTRAVENOUS

## 2021-06-01 MED ORDER — ONDANSETRON HCL 4 MG/2ML IJ SOLN
4.0000 mg | Freq: Once | INTRAMUSCULAR | Status: AC
  Administered 2021-06-01: 4 mg via INTRAVENOUS
  Filled 2021-06-01: qty 2

## 2021-06-01 MED ORDER — ACETAMINOPHEN 500 MG PO TABS: 1000.0000 mg | ORAL_TABLET | Freq: Three times a day (TID) | ORAL | 2 refills | Status: AC | PRN

## 2021-06-01 MED ORDER — SODIUM CHLORIDE 0.9 % IV BOLUS
1000.0000 mL | Freq: Once | INTRAVENOUS | Status: AC
  Administered 2021-06-01: 1000 mL via INTRAVENOUS

## 2021-06-01 MED ORDER — GABAPENTIN 300 MG PO CAPS
300.0000 mg | ORAL_CAPSULE | ORAL | Status: AC
  Administered 2021-06-01: 300 mg via ORAL
  Filled 2021-06-01: qty 1

## 2021-06-01 MED ORDER — POLYETHYLENE GLYCOL 3350 17 G PO PACK
17.0000 g | PACK | Freq: Every day | ORAL | Status: DC
  Administered 2021-06-01 – 2021-06-02 (×2): 17 g via ORAL
  Filled 2021-06-01 (×2): qty 1

## 2021-06-01 MED ORDER — BISACODYL 10 MG RE SUPP: 10.0000 mg | Freq: Every day | RECTAL | Status: DC | PRN

## 2021-06-01 MED ORDER — IOHEXOL 300 MG/ML  SOLN
100.0000 mL | Freq: Once | INTRAMUSCULAR | Status: AC | PRN
Start: 2021-06-01 — End: 2021-06-01
  Administered 2021-06-01: 100 mL via INTRAVENOUS

## 2021-06-01 MED ORDER — MEPERIDINE HCL 50 MG/ML IJ SOLN: 6.2500 mg | INTRAMUSCULAR | Status: DC | PRN

## 2021-06-01 MED ORDER — ONDANSETRON 4 MG PO TBDP: 4.0000 mg | ORAL_TABLET | Freq: Four times a day (QID) | ORAL | Status: DC | PRN

## 2021-06-01 MED ORDER — OXYCODONE HCL 5 MG/5ML PO SOLN: 5.0000 mg | Freq: Once | ORAL | Status: DC | PRN

## 2021-06-01 MED ORDER — KETOROLAC TROMETHAMINE 30 MG/ML IJ SOLN
INTRAMUSCULAR | Status: AC
  Filled 2021-06-01: qty 1

## 2021-06-01 MED ORDER — FAMOTIDINE IN NACL 20-0.9 MG/50ML-% IV SOLN
20.0000 mg | Freq: Once | INTRAVENOUS | Status: AC
  Administered 2021-06-01: 20 mg via INTRAVENOUS
  Filled 2021-06-01: qty 50

## 2021-06-01 MED ORDER — SUGAMMADEX SODIUM 200 MG/2ML IV SOLN
INTRAVENOUS | Status: DC | PRN
  Administered 2021-06-01: 200 mg via INTRAVENOUS

## 2021-06-01 SURGICAL SUPPLY — 31 items
APPLIER CLIP 5 13 M/L LIGAMAX5 (MISCELLANEOUS) ×2
BAG COUNTER SPONGE SURGICOUNT (BAG) IMPLANT
CABLE HIGH FREQUENCY MONO STRZ (ELECTRODE) IMPLANT
CLIP APPLIE 5 13 M/L LIGAMAX5 (MISCELLANEOUS) ×1 IMPLANT
COVER SURGICAL LIGHT HANDLE (MISCELLANEOUS) ×2 IMPLANT
CUTTER FLEX LINEAR 45M (STAPLE) ×2 IMPLANT
DECANTER SPIKE VIAL GLASS SM (MISCELLANEOUS) ×2 IMPLANT
DERMABOND ADVANCED (GAUZE/BANDAGES/DRESSINGS) ×1
DERMABOND ADVANCED .7 DNX12 (GAUZE/BANDAGES/DRESSINGS) ×1 IMPLANT
GLOVE SURG POLY ORTHO LF SZ7.5 (GLOVE) ×2 IMPLANT
GOWN STRL REUS W/TWL XL LVL3 (GOWN DISPOSABLE) ×4 IMPLANT
GRASPER SUT TROCAR 14GX15 (MISCELLANEOUS) ×2 IMPLANT
IV LACTATED RINGERS 1000ML (IV SOLUTION) ×2 IMPLANT
KIT BASIN OR (CUSTOM PROCEDURE TRAY) ×2 IMPLANT
KIT TURNOVER KIT A (KITS) ×2 IMPLANT
POUCH RETRIEVAL ECOSAC 10 (ENDOMECHANICALS) ×1 IMPLANT
POUCH RETRIEVAL ECOSAC 10MM (ENDOMECHANICALS) ×1
RELOAD 45 VASCULAR/THIN (ENDOMECHANICALS) ×2 IMPLANT
RELOAD STAPLE TA45 3.5 REG BLU (ENDOMECHANICALS) IMPLANT
SET IRRIG TUBING LAPAROSCOPIC (IRRIGATION / IRRIGATOR) ×2 IMPLANT
SET TUBE SMOKE EVAC HIGH FLOW (TUBING) ×2 IMPLANT
SHEARS HARMONIC ACE PLUS 36CM (ENDOMECHANICALS) ×2 IMPLANT
SLEEVE XCEL OPT CAN 5 100 (ENDOMECHANICALS) ×2 IMPLANT
STRIP CLOSURE SKIN 1/2X4 (GAUZE/BANDAGES/DRESSINGS) IMPLANT
SUT MNCRL AB 4-0 PS2 18 (SUTURE) ×2 IMPLANT
SUT VICRYL 0 TIES 12 18 (SUTURE) IMPLANT
TOWEL OR 17X26 10 PK STRL BLUE (TOWEL DISPOSABLE) ×2 IMPLANT
TRAY FOLEY MTR SLVR 16FR STAT (SET/KITS/TRAYS/PACK) ×2 IMPLANT
TRAY LAPAROSCOPIC (CUSTOM PROCEDURE TRAY) ×2 IMPLANT
TROCAR BLADELESS OPT 5 100 (ENDOMECHANICALS) ×2 IMPLANT
TROCAR XCEL BLUNT TIP 100MML (ENDOMECHANICALS) ×2 IMPLANT

## 2021-06-01 NOTE — ED Provider Notes (Signed)
[facility] DEPT [facility] Emergency Department Provider Note MRN:  [id number]  Arrival date & time: 06/01/21     Chief Complaint   Abdominal Pain and Nausea   History of Present Illness   Mary Stevenson is a 40 y.o. year-old female with no pertinent past medical history presenting to the ED with chief complaint of abdominal pain.  Location: Left upper Duration: 1 day Onset: Gradual Timing: Constant Description: Dull crampy pain Severity: Mild to moderate Exacerbating/Alleviating Factors: None Associated Symptoms: Nausea, vomiting, diarrhea, poor p.o. intake Pertinent Negatives: Denies fever, no sick contacts, no chest pain, no shortness of breath, no lower abdominal pain, no vaginal bleeding or discharge, no dysuria.   Review of Systems  A complete 10 system review of systems was obtained and all systems are negative except as noted in the HPI and PMH.   Patient's Health History    Past Medical History:  Diagnosis Date   Medical history non-contributory     Past Surgical History:  Procedure Laterality Date   NO PAST SURGERIES      Family History  Problem Relation Age of Onset   Heart disease Brother     Social History   Socioeconomic History   Marital status: Single    Spouse name: Not on file   Number of children: Not on file   Years of education: Not on file   Highest education level: Not on file  Occupational History   Not on file  Tobacco Use   Smoking status: Never   Smokeless tobacco: Not on file  Substance and Sexual Activity   Alcohol use: No   Drug use: No   Sexual activity: Yes  Other Topics Concern   Not on file  Social History Narrative   Not on file   Social Determinants of Health   Financial Resource Strain: Not on file  Food Insecurity: Not on file  Transportation Needs: Not on file  Physical Activity: Not on file  Stress: Not on file  Social Connections: Not on file  Intimate Partner Violence: Not on file     Physical  Exam   Vitals:   06/01/21 0600 06/01/21 0615  BP: 129/82 129/82  Pulse: 69 69  Resp:  16  Temp:    SpO2: 100% 100%    CONSTITUTIONAL: Well-appearing, NAD NEURO:  Alert and oriented x 3, no focal deficits EYES:  eyes equal and reactive ENT/NECK:  no LAD, no JVD CARDIO: Regular rate, well-perfused, normal S1 and S2 PULM:  CTAB no wheezing or rhonchi GI/GU:  normal bowel sounds, non-distended, non-tender MSK/SPINE:  No gross deformities, no edema SKIN:  no rash, atraumatic PSYCH:  Appropriate speech and behavior  *Additional and/or pertinent findings included in MDM below  Diagnostic and Interventional Summary    EKG Interpretation  Date/Time:    Ventricular Rate:    PR Interval:    QRS Duration:   QT Interval:    QTC Calculation:   R Axis:     Text Interpretation:          Labs Reviewed  CBC - Abnormal; Notable for the following components:      Result Value   WBC 13.4 (*)    All other components within normal limits  COMPREHENSIVE METABOLIC PANEL - Abnormal; Notable for the following components:   Glucose, Bld 145 (*)    All other components within normal limits  LIPASE, BLOOD  I-STAT BETA HCG BLOOD, ED ([facility], [facility], [facility] ONLY)    CT ABDOMEN PELVIS  W CONTRAST    (Results Pending)    Medications  sodium chloride 0.9 % bolus 1,000 mL (0 mLs Intravenous Stopped 06/01/21 0549)  ondansetron (ZOFRAN) injection 4 mg (4 mg Intravenous Given 06/01/21 0501)  famotidine (PEPCID) IVPB 20 mg premix (0 mg Intravenous Stopped 06/01/21 0549)  fentaNYL (SUBLIMAZE) injection 25 mcg (25 mcg Intravenous Given 06/01/21 0501)  HYDROmorphone (DILAUDID) injection 0.5 mg (0.5 mg Intravenous Given 06/01/21 0615)     Procedures  /  Critical Care Procedures  ED Course and Medical Decision Making  I have reviewed the triage vital signs, the nursing notes, and pertinent available records from the EMR.  Listed above are laboratory and imaging tests that I personally ordered, reviewed, and  interpreted and then considered in my medical decision making (see below for details).  Suspect gastroenteritis, also considering pancreatitis, electrolyte disturbance, AKI in the setting of dehydration.  Abdomen is soft and nontender with no rebound guarding or rigidity.  Specifically no McBurney's point tenderness.  Awaiting labs, providing symptomatic management and will reassess.     On reassessment patient is having continued pain, worse not better.  Abdomen is now diffusely tender.  Will need to obtain CT imaging to exclude surgical process.  Signed out to oncoming provider at shift change.  Elmer Sow. Pilar Plate, MD [facility] Emergency Medicine [facility] [email or web address]  Final Clinical Impressions(s) / ED Diagnoses     ICD-10-CM   1. Generalized abdominal pain  R10.84       ED Discharge Orders     None        Discharge Instructions Discussed with and Provided to Patient:   Discharge Instructions   None       Sabas Sous, MD 06/01/21 [phone]

## 2021-06-01 NOTE — ED Provider Notes (Signed)
Pt signed out by Dr. Pilar Plate pending CT abd/pelvis.  Pt still has some pain in the RLQ, but is feeling better after meds.    IMPRESSION: Acute appendicitis with 39mm appendicolith at the base of the appendix. No focal fluid collection.   Appendix: Location: Pelvis   Diameter: 1.3 cm   Appendicolith: Present, 9 mm   Mucosal hyper-enhancement: Present   Extraluminal gas: None   Periappendiceal collection: None  Covid swab ordered.  IV rocephin/flagyl given to pt.  Pt d/w Dr. Andrey Campanile (surgery) who will take her to the OR.   Jacalyn Lefevre, MD 06/01/21 [phone]

## 2021-06-01 NOTE — H&P (Signed)
CC: stomach pain  Requesting provider: Dr Particia Nearing  HPI: Mary Stevenson is an 40 y.o. female who is here for evaluation of acute onset of abdominal pain.  Patient points to her upper abdomen.  It was gradual and got worse.  She had nausea and vomiting but no fever.  May be some subjective chills.  She denies any prior symptoms.  Her daughter functions as a Nurse, learning disability.  She came to the emergency room where work-up was commenced a CT scan was performed which demonstrated acute appendicitis.  The patient states the pain is now in her right lower abdomen.  No prior abdominal surgeries.  Denies any history of hypertension, diabetes, heart problems, lung problems or kidney problems.  Does not smoke.  No dysuria or hematuria  Past Medical History:  Diagnosis Date   Medical history non-contributory     Past Surgical History:  Procedure Laterality Date   NO PAST SURGERIES      Family History  Problem Relation Age of Onset   Heart disease Brother     Social:  reports that she has never smoked. She has never used smokeless tobacco. She reports that she does not drink alcohol and does not use drugs.  Allergies: No Known Allergies  Medications: I have reviewed the patient's current medications.   ROS - all of the below systems have been reviewed with the patient and positives are indicated with bold text General: chills, fever or night sweats Eyes: blurry vision or double vision ENT: epistaxis or sore throat Allergy/Immunology: itchy/watery eyes or nasal congestion Hematologic/Lymphatic: bleeding problems, blood clots or swollen lymph nodes Endocrine: temperature intolerance or unexpected weight changes Breast: new or changing breast lumps or nipple discharge Resp: cough, shortness of breath, or wheezing CV: chest pain or dyspnea on exertion GI: as per HPI GU: dysuria, trouble voiding, or hematuria MSK: joint pain or joint stiffness Neuro: TIA or stroke symptoms Derm: pruritus and  skin lesion changes Psych: anxiety and depression  PE Blood pressure 127/81, pulse 73, temperature 97.7 F (36.5 C), temperature source Oral, resp. rate 18, height 4\' 11"  (1.499 m), weight 65.8 kg, last menstrual period 05/13/2021, SpO2 99 %, unknown if currently breastfeeding. Constitutional: NAD; conversant; no deformities Eyes: Moist conjunctiva; no lid lag; anicteric; PERRL Neck: Trachea midline; no thyromegaly Lungs: Normal respiratory effort; no tactile fremitus CV: RRR; no palpable thrills; no pitting edema GI: Abd soft, RLQ TTP, some guarding, no peritonitis; no palpable hepatosplenomegaly MSK: Normal gait; no clubbing/cyanosis Psychiatric: Appropriate affect; alert and oriented x3 Lymphatic: No palpable cervical or axillary lymphadenopathy Skin:no rash/lesions/induration  Results for orders placed or performed during the hospital encounter of 06/01/21 (from the past 48 hour(s))  CBC     Status: Abnormal   Collection Time: 06/01/21  5:04 AM  Result Value Ref Range   WBC 13.4 (H) 4.0 - 10.5 K/uL   RBC 4.71 3.87 - 5.11 MIL/uL   Hemoglobin 14.4 12.0 - 15.0 g/dL   HCT 06/03/21 48.2 - 50.0 %   MCV 85.6 80.0 - 100.0 fL   MCH 30.6 26.0 - 34.0 pg   MCHC 35.7 30.0 - 36.0 g/dL   RDW 37.0 48.8 - 89.1 %   Platelets 367 150 - 400 K/uL   nRBC 0.0 0.0 - 0.2 %    Comment: Performed at [facility], [address] [place], [place]  Comprehensive metabolic panel     Status: Abnormal   Collection Time: 06/01/21  5:04 AM  Result Value  Ref Range   Sodium 136 135 - 145 mmol/L   Potassium 4.1 3.5 - 5.1 mmol/L   Chloride 102 98 - 111 mmol/L   CO2 27 22 - 32 mmol/L   Glucose, Bld 145 (H) 70 - 99 mg/dL    Comment: Glucose reference range applies only to samples taken after fasting for at least 8 hours.   BUN 10 6 - 20 mg/dL   Creatinine, Ser 5.57 0.44 - 1.00 mg/dL   Calcium 9.0 8.9 - 32.2 mg/dL   Total Protein 8.1 6.5 - 8.1 g/dL   Albumin 3.9 3.5 - 5.0 g/dL   AST  22 15 - 41 U/L   ALT 20 0 - 44 U/L   Alkaline Phosphatase 69 38 - 126 U/L   Total Bilirubin 0.5 0.3 - 1.2 mg/dL   GFR, Estimated >02 >54 mL/min    Comment: (NOTE) Calculated using the CKD-EPI Creatinine Equation (2021)    Anion gap 7 5 - 15    Comment: Performed at [facility], [address] [place], [place] [address]  Lipase, blood     Status: None   Collection Time: 06/01/21  5:04 AM  Result Value Ref Range   Lipase 25 11 - 51 U/L    Comment: Performed at [facility], [address], [place], [place] [address]  I-Stat beta hCG blood, ED ([facility], [facility], [facility] only)     Status: None   Collection Time: 06/01/21  5:09 AM  Result Value Ref Range   I-stat hCG, quantitative <5.0 <5 mIU/mL   Comment 3            Comment:   GEST. AGE      CONC.  (mIU/mL)   <=1 WEEK        5 - 50     2 WEEKS       50 - 500     3 WEEKS       100 - 10,000     4 WEEKS     1,000 - 30,000        FEMALE AND NON-PREGNANT FEMALE:     LESS THAN 5 mIU/mL   Resp Panel by RT-PCR (Flu A&B, Covid) Nasopharyngeal Swab     Status: None   Collection Time: 06/01/21  7:56 AM   Specimen: Nasopharyngeal Swab; Nasopharyngeal(NP) swabs in vial transport medium  Result Value Ref Range   SARS Coronavirus 2 by RT PCR NEGATIVE NEGATIVE    Comment: (NOTE) SARS-CoV-2 target nucleic acids are NOT DETECTED.  The SARS-CoV-2 RNA is generally detectable in upper respiratory specimens during the acute phase of infection. The lowest concentration of SARS-CoV-2 viral copies this assay can detect is 138 copies/mL. A negative result does not preclude SARS-Cov-2 infection and should not be used as the sole basis for treatment or other patient management decisions. A negative result may occur with  improper specimen collection/handling, submission of specimen other than nasopharyngeal swab, presence of viral mutation(s) within the areas targeted by this assay, and inadequate number of viral copies(<138  copies/mL). A negative result must be combined with clinical observations, patient history, and epidemiological information. The expected result is Negative.  Fact Sheet for Patients:  BloggerCourse.com  Fact Sheet for Healthcare Providers:  SeriousBroker.it  This test is no t yet approved or cleared by the [place] FDA and  has been authorized for detection and/or diagnosis of SARS-CoV-2 by FDA under an Emergency Use Authorization (EUA). This EUA will remain  in effect (meaning this test can be used) for the duration of the COVID-19 declaration under Section 564(b)(1) of the Act, 21 U.S.C.section 360bbb-3(b)(1), unless the authorization is terminated  or revoked sooner.       Influenza A by PCR NEGATIVE NEGATIVE   Influenza B by PCR NEGATIVE NEGATIVE    Comment: (NOTE) The Xpert Xpress SARS-CoV-2/FLU/RSV plus assay is intended as an aid in the diagnosis of influenza from Nasopharyngeal swab specimens and should not be used as a sole basis for treatment. Nasal washings and aspirates are unacceptable for Xpert Xpress SARS-CoV-2/FLU/RSV testing.  Fact Sheet for Patients: BloggerCourse.comhttps://www.fda.gov/media/152166/download  Fact Sheet for Healthcare Providers: SeriousBroker.ithttps://www.fda.gov/media/152162/download  This test is not yet approved or cleared by the [place] FDA and has been authorized for detection and/or diagnosis of SARS-CoV-2 by FDA under an Emergency Use Authorization (EUA). This EUA will remain in effect (meaning this test can be used) for the duration of the COVID-19 declaration under Section 564(b)(1) of the Act, 21 U.S.C. section 360bbb-3(b)(1), unless the authorization is terminated or revoked.  Performed at [facility], [address] [place], [place] [id number]     CT ABDOMEN PELVIS W CONTRAST  Result Date: 06/01/2021 CLINICAL DATA:  Abdominal pain, acute, nonlocalized EXAM: CT ABDOMEN AND  PELVIS WITH CONTRAST TECHNIQUE: Multidetector CT imaging of the abdomen and pelvis was performed using the standard protocol following bolus administration of intravenous contrast. CONTRAST:  100mL OMNIPAQUE IOHEXOL 300 MG/ML  SOLN COMPARISON:  None. FINDINGS: Lower chest: Bibasilar hypoventilatory changes. Hepatobiliary: No focal liver abnormality is seen. No gallstones, gallbladder wall thickening, or biliary dilatation. Pancreas: Unremarkable. No pancreatic ductal dilatation or surrounding inflammatory changes. Spleen: Normal in size without focal abnormality. Adrenals/Urinary Tract: Adrenal glands are unremarkable. Kidneys are normal, without renal calculi, focal lesion, or hydronephrosis. Bladder is unremarkable. Stomach/Bowel: The stomach is within normal limits. There is no evidence of bowel obstruction. The appendix is dilated measuring 1.3 cm (axial image 6, coronal image 43). There is a 9 mm appendicolith at the base of the appendix (axial image 62, coronal image 48), and possibly another within the mid appendix (coronal image 43). Vascular/Lymphatic: No significant vascular findings are present. No enlarged abdominal or pelvic lymph nodes. Reproductive: Unremarkable. Other: No abdominal wall hernia or abnormality. No abdominopelvic ascites. Musculoskeletal: No acute or significant osseous findings. IMPRESSION: Acute appendicitis with 9mm appendicolith at the base of the appendix. No focal fluid collection. Appendix: Location: Pelvis Diameter: 1.3 cm Appendicolith: Present, 9 mm Mucosal hyper-enhancement: Present Extraluminal gas: None Periappendiceal collection: None These results were called by telephone at the time of interpretation on 06/01/2021 at 7:38 am to provider Dr. Jacalyn LefevreJulie Haviland, who verbally acknowledged these results. Electronically Signed   By: Caprice RenshawJacob  Kahn   On: 06/01/2021 07:38    Imaging: reviewed  A/P: Lynne LoganDelmys Stevenson is an 40 y.o. female with  Acute appendicitis  I offered to use  video interpreter but pt and daughter declined.   We discussed the etiology and management of acute appendicitis. We discussed operative and nonoperative management.  I recommended operative management along with IV antibiotics.  We discussed laparoscopic appendectomy. We discussed the risk and benefits of surgery including but not limited to bleeding, infection, injury to surrounding structures, need to convert to an open procedure, blood clot formation, post operative abscess or wound infection, staple line complications such as leak or bleeding, hernia formation, post operative ileus, need for additional procedures, anesthesia complications, and the typical postoperative course. I explained that the  patient should expect a good improvement in their symptoms.  Patient was provided information in Spanish regarding appendicitis  IV antibiotic Enhanced recovery protocol To operating room later this morning for laparoscopic appendectomy  Discussed discharge instructions along with restrictions   Mary Sella. Andrey Campanile, MD, FACS General, Bariatric, & Minimally Invasive Surgery [facility], [place]

## 2021-06-01 NOTE — ED Triage Notes (Signed)
Pt to ED with c/o abdominal pain and NVD which began yesterday. Arrives A+O, VSS, NADN.

## 2021-06-01 NOTE — Discharge Instructions (Signed)
[facility] LAPAROSCOPIC SURGERY: POST OP INSTRUCTIONS Always review your discharge instruction sheet given to you by the facility where your surgery was performed. IF YOU HAVE DISABILITY OR FAMILY LEAVE FORMS, YOU MUST BRING THEM TO THE OFFICE FOR PROCESSING.   DO NOT GIVE THEM TO YOUR DOCTOR.  PAIN CONTROL  First take acetaminophen (Tylenol) AND/or ibuprofen (Advil) to control your pain after surgery.  Follow directions on package.  Taking acetaminophen (Tylenol) and/or ibuprofen (Advil) regularly after surgery will help to control your pain and lower the amount of prescription pain medication you may need.  You should not take more than 3,000 mg (3 grams) of acetaminophen (Tylenol) in 24 hours.  You should not take ibuprofen (Advil), aleve, motrin, naprosyn or other NSAIDS if you have a history of stomach ulcers or chronic kidney disease.  A prescription for pain medication may be given to you upon discharge.  Take your pain medication as prescribed, if you still have uncontrolled pain after taking acetaminophen (Tylenol) or ibuprofen (Advil). Use ice packs to help control pain. If you need a refill on your pain medication, please contact your pharmacy.  They will contact our office to request authorization. Prescriptions will not be filled after 5pm or on week-ends.  HOME MEDICATIONS Take your usually prescribed medications unless otherwise directed.  DIET You should follow a light diet the first few days after arrival home.  Be sure to include lots of fluids daily. Avoid fatty, fried foods.   CONSTIPATION It is common to experience some constipation after surgery and if you are taking pain medication.  Increasing fluid intake and taking a stool softener (such as Colace) will usually help or prevent this problem from occurring.  A mild laxative (Milk of Magnesia or Miralax) should be taken according to package instructions if there are no bowel movements after 48  hours.  WOUND/INCISION CARE Most patients will experience some swelling and bruising in the area of the incisions.  Ice packs will help.  Swelling and bruising can take several days to resolve.  Unless discharge instructions indicate otherwise, follow guidelines below  STERI-STRIPS - you may remove your outer bandages 48 hours after surgery, and you may shower at that time.  You have steri-strips (small skin tapes) in place directly over the incision.  These strips should be left on the skin for 7-10 days.   DERMABOND/SKIN GLUE - you may shower in 24 hours.  The glue will flake off over the next 2-3 weeks. Any sutures or staples will be removed at the office during your follow-up visit.  ACTIVITIES You may resume regular (light) daily activities beginning the next day--such as daily self-care, walking, climbing stairs--gradually increasing activities as tolerated.  You may have sexual intercourse when it is comfortable.  Refrain from any heavy lifting or straining until approved by your doctor. You may drive when you are no longer taking prescription pain medication, you can comfortably wear a seatbelt, and you can safely maneuver your car and apply brakes.  FOLLOW-UP You should see your doctor in the office for a follow-up appointment approximately 2-3 weeks after your surgery.  You should have been given your post-op/follow-up appointment when your surgery was scheduled.  If you did not receive a post-op/follow-up appointment, make sure that you call for this appointment within a day or two after you arrive home to insure a convenient appointment time.  OTHER INSTRUCTIONS   WHEN TO CALL YOUR DOCTOR: Fever over 101.0 Inability to urinate Continued   bleeding from incision. Increased pain, redness, or drainage from the incision. Increasing abdominal pain  The clinic staff is available to answer your questions during regular business hours.  Please don't hesitate to call and ask to speak to  one of the nurses for clinical concerns.  If you have a medical emergency, go to the nearest emergency room or call 911.  A surgeon from [facility] is always on call at the hospital. [address], [address], [place], [place]  [address] ? [address] [address], [place], [place]   [address] [phone] ? [phone] ? FAX [phone] Web site: [email or web address]     Managing Your Pain After Surgery Without Opioids    Thank you for participating in our program to help patients manage their pain after surgery without opioids. This is part of our effort to provide you with the best care possible, without exposing you or your family to the risk that opioids pose.  What pain can I expect after surgery? You can expect to have some pain after surgery. This is normal. The pain is typically worse the day after surgery, and quickly begins to get better. Many studies have found that many patients are able to manage their pain after surgery with Over-the-Counter (OTC) medications such as Tylenol and Motrin. If you have a condition that does not allow you to take Tylenol or Motrin, notify your surgical team.  How will I manage my pain? The best strategy for controlling your pain after surgery is around the clock pain control with Tylenol (acetaminophen) and Motrin (ibuprofen or Advil). Alternating these medications with each other allows you to maximize your pain control. In addition to Tylenol and Motrin, you can use heating pads or ice packs on your incisions to help reduce your pain.  How will I alternate your regular strength over-the-counter pain medication? You will take a dose of pain medication every three hours. Start by taking 650 mg of Tylenol (2 pills of 325 mg) 3 hours later take 600 mg of Motrin (3 pills of 200 mg) 3 hours after taking the Motrin take 650 mg of Tylenol 3 hours after that take 600 mg of Motrin.   - 1 -  See example - if your first dose of  Tylenol is at 12:00 PM   12:00 PM Tylenol 650 mg (2 pills of 325 mg)  3:00 PM Motrin 600 mg (3 pills of 200 mg)  6:00 PM Tylenol 650 mg (2 pills of 325 mg)  9:00 PM Motrin 600 mg (3 pills of 200 mg)  Continue alternating every 3 hours   We recommend that you follow this schedule around-the-clock for at least 3 days after surgery, or until you feel that it is no longer needed. Use the table on the last page of this handout to keep track of the medications you are taking. Important: Do not take more than 3000mg of Tylenol or 1600mg of Motrin in a 24-hour period. Do not take ibuprofen/Motrin if you have a history of bleeding stomach ulcers, severe kidney disease, &/or actively taking a blood thinner  What if I still have pain? If you have pain that is not controlled with the over-the-counter pain medications (Tylenol and Motrin or Advil) you might have what we call "breakthrough" pain. You will receive a prescription for a small amount of an opioid pain medication such as Oxycodone, Tramadol, or Tylenol with Codeine. Use these opioid pills in the first 24 hours after surgery if you have breakthrough   pain. Do not take more than 1 pill every 4-6 hours.  If you still have uncontrolled pain after using all opioid pills, don't hesitate to call our staff using the number provided. We will help make sure you are managing your pain in the best way possible, and if necessary, we can provide a prescription for additional pain medication.   Day 1    Time  Name of Medication Number of pills taken  Amount of Acetaminophen  Pain Level   Comments  AM PM       AM PM       AM PM       AM PM       AM PM       AM PM       AM PM       AM PM       Total Daily amount of Acetaminophen Do not take more than  3,000 mg per day      Day 2    Time  Name of Medication Number of pills taken  Amount of Acetaminophen  Pain Level   Comments  AM PM       AM PM       AM PM       AM PM       AM PM        AM PM       AM PM       AM PM       Total Daily amount of Acetaminophen Do not take more than  3,000 mg per day      Day 3    Time  Name of Medication Number of pills taken  Amount of Acetaminophen  Pain Level   Comments  AM PM       AM PM       AM PM       AM PM          AM PM       AM PM       AM PM       AM PM       Total Daily amount of Acetaminophen Do not take more than  3,000 mg per day      Day 4    Time  Name of Medication Number of pills taken  Amount of Acetaminophen  Pain Level   Comments  AM PM       AM PM       AM PM       AM PM       AM PM       AM PM       AM PM       AM PM       Total Daily amount of Acetaminophen Do not take more than  3,000 mg per day      Day 5    Time  Name of Medication Number of pills taken  Amount of Acetaminophen  Pain Level   Comments  AM PM       AM PM       AM PM       AM PM       AM PM       AM PM       AM PM       AM PM       Total Daily amount of Acetaminophen Do not take more   than  3,000 mg per day       Day 6    Time  Name of Medication Number of pills taken  Amount of Acetaminophen  Pain Level  Comments  AM PM       AM PM       AM PM       AM PM       AM PM       AM PM       AM PM       AM PM       Total Daily amount of Acetaminophen Do not take more than  3,000 mg per day      Day 7    Time  Name of Medication Number of pills taken  Amount of Acetaminophen  Pain Level   Comments  AM PM       AM PM       AM PM       AM PM       AM PM       AM PM       AM PM       AM PM       Total Daily amount of Acetaminophen Do not take more than  3,000 mg per day        For additional information about how and where to safely dispose of unused opioid medications - https://www.morepowerfulnc.org  Disclaimer: This document contains information and/or instructional materials adapted from [facility] for the typical patient with your condition. It does not  replace medical advice from your health care provider because your experience may differ from that of the typical patient. Talk to your health care provider if you have any questions about this document, your condition or your treatment plan. Adapted from [facility]  

## 2021-06-01 NOTE — Transfer of Care (Signed)
Immediate Anesthesia Transfer of Care Note  Patient: Mary Stevenson  Procedure(s) Performed: Procedure(s): APPENDECTOMY LAPAROSCOPIC (N/A)  Patient Location: PACU  Anesthesia Type:General  Level of Consciousness: Alert, Awake, Oriented  Airway & Oxygen Therapy: Patient Spontanous Breathing  Post-op Assessment: Report given to RN  Post vital signs: Reviewed and stable  Last Vitals:  Vitals:   06/01/21 1000 06/01/21 1109  BP: 117/78 125/78  Pulse: 78 79  Resp: 18 20  Temp:  36.9 C  SpO2: 98% 98%    Complications: No apparent anesthesia complications

## 2021-06-01 NOTE — Anesthesia Preprocedure Evaluation (Addendum)
Anesthesia Evaluation  Patient identified by MRN, date of birth, ID band Patient awake    Reviewed: Allergy & Precautions, H&P , NPO status , Patient's Chart, lab work & pertinent test results, reviewed documented beta blocker date and time   Airway Mallampati: II  TM Distance: >3 FB Neck ROM: Full    Dental no notable dental hx. (+) Teeth Intact, Dental Advisory Given   Pulmonary neg pulmonary ROS,    Pulmonary exam normal breath sounds clear to auscultation       Cardiovascular Exercise Tolerance: Good negative cardio ROS Normal cardiovascular exam Rhythm:Regular Rate:Normal     Neuro/Psych negative neurological ROS  negative psych ROS   GI/Hepatic negative GI ROS, Neg liver ROS,   Endo/Other  negative endocrine ROS  Renal/GU negative Renal ROS  negative genitourinary   Musculoskeletal   Abdominal   Peds  Hematology negative hematology ROS (+)   Anesthesia Other Findings   Reproductive/Obstetrics negative OB ROS                            Anesthesia Physical Anesthesia Plan  ASA: 2 and emergent  Anesthesia Plan: General   Post-op Pain Management:    Induction: Cricoid pressure planned and Intravenous  PONV Risk Score and Plan: 3 and Ondansetron, Dexamethasone and Treatment may vary due to age or medical condition  Airway Management Planned: Oral ETT  Additional Equipment: None  Intra-op Plan:   Post-operative Plan: Extubation in OR  Informed Consent: I have reviewed the patients History and Physical, chart, labs and discussed the procedure including the risks, benefits and alternatives for the proposed anesthesia with the patient or authorized representative who has indicated his/her understanding and acceptance.     Dental Advisory Given  Plan Discussed with: CRNA and Anesthesiologist  Anesthesia Plan Comments:         Anesthesia Quick Evaluation

## 2021-06-01 NOTE — Op Note (Signed)
Mary Stevenson [id number] [birth year] 06/01/2021  Appendectomy, Lap, Procedure Note  Indications: The patient presented with a history of right-sided abdominal pain. A CT revealed findings consistent with acute appendicitis.  Pre-operative Diagnosis: Acute appendicitis without mention of peritonitis  Post-operative Diagnosis: Same  Surgeon: Gaynelle Adu MD FACS  Assistants: none  Anesthesia: General endotracheal anesthesia  Procedure Details  The patient was seen again in the Holding Room. The risks, benefits, complications, treatment options, and expected outcomes were discussed with the patient and/or family. The possibilities of perforation of viscus, bleeding, recurrent infection, the need for additional procedures, failure to diagnose a condition, and creating a complication requiring transfusion or operation were discussed. There was concurrence with the proposed plan and informed consent was obtained. The site of surgery was properly noted. The patient was taken to Operating Room, identified as [facility] and the procedure verified as Appendectomy. A Time Out was held and the above information confirmed.  The patient was placed in the supine position and general anesthesia was induced, along with placement of orogastric tube, SCDs, and pt emptied their bladder prior to surgery. The abdomen was prepped and draped in a sterile fashion. A 1.5 centimeter infraumbilical incision was made.  The umbilical stalk was elevated, and the midline fascia was incised with a #11 blade.  A Kelly clamp was used to confirm entrance into the peritoneal cavity.  A pursestring suture was passed around the incision with a 0 Vicryl.  A 13mm Hasson was introduced into the abdomen and the tails of the suture were used to hold the Hasson in place.   The pneumoperitoneum was then established to steady pressure of 15 mmHg.  Additional 5 mm cannulas then placed in the left lower quadrant of the abdomen and the  suprapubic region under direct visualization. A careful evaluation of the entire abdomen was carried out. The patient was placed in Trendelenburg and left lateral decubitus position. The small intestines were retracted in the cephalad and left lateral direction away from the pelvis and right lower quadrant. The patient was found to have an inflammed appendix that was extending into the pelvis and loosely stuck to the right ovary. There was no evidence of perforation.  The appendix was carefully dissected. The appendix was was skeletonized with the harmonic scalpel.   The appendix was divided at its base using an endo-GIA stapler with a white load. No appendiceal stump was left in place. The appendix was removed from the abdomen with an Ecco bag through the umbilical port.  There was a little oozing from 2 small places along the staple line. 82mm clips were placed. There was no evidence of bleeding, leakage, or complication after division of the appendix. Irrigation was also performed and irrigate suctioned from the abdomen as well. Staple line re-inspected and there was no bleeding.  The umbilical port site was closed with the purse string suture. The closure was viewed laparoscopically. There was a small residual palpable fascial defect. Two additional interrupted 0 vicryl sutures were placed at the umbilical fascia with PMI suture passer with lap assistance. No defect palpable.  The trocar site skin wounds were closed with 4-0 Monocryl. Dermabond was applied to the skin incisions.  Instrument, sponge, and needle counts were correct at the conclusion of the case.   Findings: The appendix was found to be inflamed. There were early signs of necrosis in the tip.  There was not perforation. There was not abscess formation.  Estimated Blood Loss:  Minimal  Drains: drains         Specimens: appendix         Complications:  None; patient tolerated the procedure well.         Disposition: PACU -  hemodynamically stable.         Condition: stable  Mary Sella. Andrey Campanile, MD, FACS General, Bariatric, & Minimally Invasive Surgery [facility]

## 2021-06-01 NOTE — Anesthesia Procedure Notes (Signed)
Procedure Name: Intubation Date/Time: 06/01/2021 1:38 PM Performed by: Gerald Leitz, CRNA Pre-anesthesia Checklist: Patient identified, Patient being monitored, Timeout performed, Emergency Drugs available and Suction available Patient Re-evaluated:Patient Re-evaluated prior to induction Oxygen Delivery Method: Circle system utilized Preoxygenation: Pre-oxygenation with 100% oxygen Induction Type: IV induction Ventilation: Mask ventilation without difficulty Laryngoscope Size: Mac and 3 Grade View: Grade I Tube type: Oral Tube size: 7.0 mm Number of attempts: 1 Placement Confirmation: ETT inserted through vocal cords under direct vision, positive ETCO2 and breath sounds checked- equal and bilateral Secured at: 21 cm Tube secured with: Tape Dental Injury: Teeth and Oropharynx as per pre-operative assessment

## 2021-06-01 NOTE — ED Notes (Signed)
Patient undressed into a gown and panties, all jewelery removed and given to daughter who is at bedside.

## 2021-06-02 ENCOUNTER — Encounter (HOSPITAL_COMMUNITY): Payer: Self-pay | Admitting: General Surgery

## 2021-06-02 LAB — HIV ANTIBODY (ROUTINE TESTING W REFLEX): HIV Screen 4th Generation wRfx: NONREACTIVE

## 2021-06-02 LAB — SURGICAL PATHOLOGY

## 2021-06-02 NOTE — Anesthesia Postprocedure Evaluation (Signed)
Anesthesia Post Note  Patient: Koda Laverne  Procedure(s) Performed: APPENDECTOMY LAPAROSCOPIC     Patient location during evaluation: PACU Anesthesia Type: General Level of consciousness: awake and alert Pain management: pain level controlled Vital Signs Assessment: post-procedure vital signs reviewed and stable Respiratory status: spontaneous breathing, nonlabored ventilation, respiratory function stable and patient connected to nasal cannula oxygen Cardiovascular status: blood pressure returned to baseline and stable Postop Assessment: no apparent nausea or vomiting Anesthetic complications: no   No notable events documented.  Last Vitals:  Vitals:   06/02/21 0120 06/02/21 0558  BP: 96/64 99/62  Pulse: 66 60  Resp:  16  Temp:  36.8 C  SpO2:  99%    Last Pain:  Vitals:   06/02/21 0558  TempSrc: Oral  PainSc:    Pain Goal: Patients Stated Pain Goal: 2 (06/02/21 0342)                 Trevor Iha

## 2021-06-02 NOTE — Progress Notes (Signed)
I have reviewed and concur with students documentation.  

## 2021-06-02 NOTE — Plan of Care (Signed)
  Problem: Activity: Goal: Risk for activity intolerance will decrease Outcome: Progressing   Problem: Pain Managment: Goal: General experience of comfort will improve Outcome: Progressing   

## 2021-06-02 NOTE — Progress Notes (Signed)
Discharge package printed and instructions given to pt and daughter. Verbalize understanding and have no question.

## 2021-06-02 NOTE — Discharge Summary (Signed)
    Patient ID: Mary Stevenson [id number] 1981-04-23 40 y.o.  Admit date: 06/01/2021 Discharge date: 06/02/2021  Admitting Diagnosis: Acute Appendicitis  Discharge Diagnosis Patient Active Problem List   Diagnosis Date Noted   Acute appendicitis 06/01/2021    Consultants None  Reason for Admission: Mary Stevenson is an 40 y.o. female who is here for evaluation of acute onset of abdominal pain.  Patient points to her upper abdomen.  It was gradual and got worse.  She had nausea and vomiting but no fever.  May be some subjective chills.  She denies any prior symptoms.  Her daughter functions as a Nurse, learning disability.  She came to the emergency room where work-up was commenced a CT scan was performed which demonstrated acute appendicitis.  The patient states the pain is now in her right lower abdomen.  No prior abdominal surgeries.  Denies any history of hypertension, diabetes, heart problems, lung problems or kidney problems.  Does not smoke.  No dysuria or hematuria  Procedures Dr. Andrey Campanile - Laparoscopic Appendectomy - 06/01/2021  Hospital Course:  The patient was admitted and underwent a laparoscopic appendectomy.  The patient tolerated the procedure well.  On POD 1, the patient was tolerating a diet, voiding well, mobilizing, and pain was controlled with oral pain medications.  The patient was stable for DC home at this time with appropriate follow up made. Return precautions discussed.   Physical Exam: Blood pressure 119/83, pulse 61, temperature 97.8 F (36.6 C), temperature source Oral, resp. rate 18, height 4\' 11"  (1.499 m), weight 65.8 kg, last menstrual period 05/13/2021, SpO2 99 %, unknown if currently breastfeeding. Gen:  Alert, NAD, pleasant HEENT: EOM's intact, pupils equal and round Card:  RRR Pulm:  CTAB, no W/R/R, effort normal Abd: Soft, ND, appropriately tender around laparoscopic incision - otherwise NT,+BS, Incisions with glue intact appears well and are without drainage,  bleeding, or signs of infection Ext:  No LE edema Psych: A&Ox3  Skin: no rashes noted, warm and dry  Allergies as of 06/02/2021   No Known Allergies      Medication List     TAKE these medications    acetaminophen 500 MG tablet Commonly known as: TYLENOL Take 2 tablets (1,000 mg total) by mouth every 8 (eight) hours as needed.   oxyCODONE 5 MG immediate release tablet Commonly known as: Roxicodone Take 1 tablet (5 mg total) by mouth every 6 (six) hours as needed for breakthrough pain.   Tri-Lo-Sprintec 0.18/0.215/0.25 MG-25 MCG tab Generic drug: Norgestimate-Ethinyl Estradiol Triphasic Take 1 tablet by mouth daily.          Follow-up Information     [facility][birth year][facility]1980 Follow up on 06/23/2021.   Specialty: General Surgery Why: 1130am. Please arrive 30 minutes prior to your appointment for paperwork. Please bring a copy of your photo ID and insurance card to the appointment. Contact information: [address] [place] [phone]                 Signed: [phone], [facility] 06/02/2021, 7:58 AM Please see Amion for pager number during day hours 7:00am-4:30pm

## 2022-03-29 IMAGING — CT CT ABD-PELV W/ CM
2 of 4 series · 16 of 46 positions shown, 18 images · IV contrast (omnipaque)
Comparison: None.

CLINICAL DATA: Abdominal pain, acute, nonlocalized

EXAM:
CT ABDOMEN AND PELVIS WITH CONTRAST
TECHNIQUE: Multidetector CT imaging of the abdomen and pelvis was performed
using the standard protocol following bolus administration of
intravenous contrast.
CONTRAST:  100mL OMNIPAQUE IOHEXOL 300 MG/ML  SOLN

[Series 2: axial st · axial · 0.67mm/px · z∈[+1152,+1537]mm · 13 of 89 slices shown, 15 images]
[im 6/89  soft-tissue]
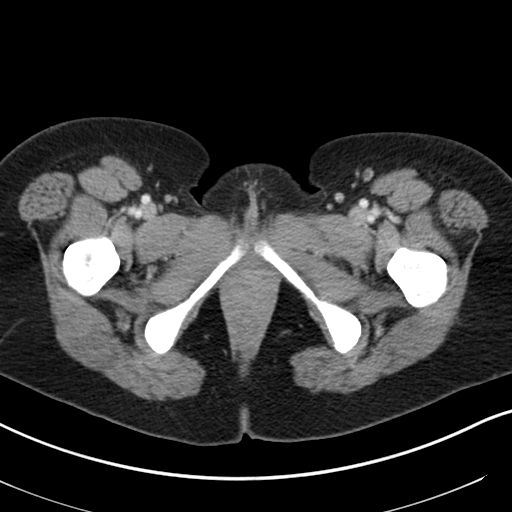
[im 6/89  bone]
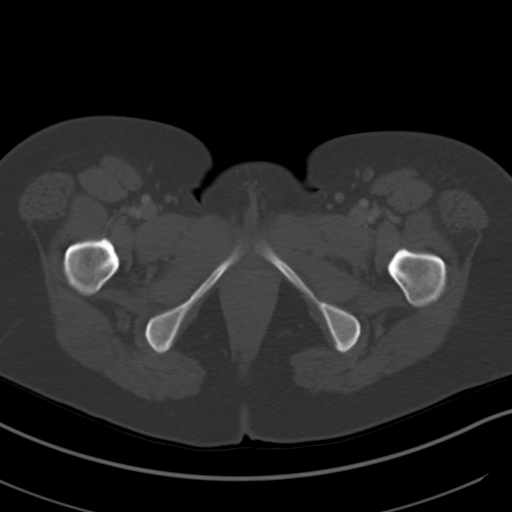
[im 12/89  soft-tissue]
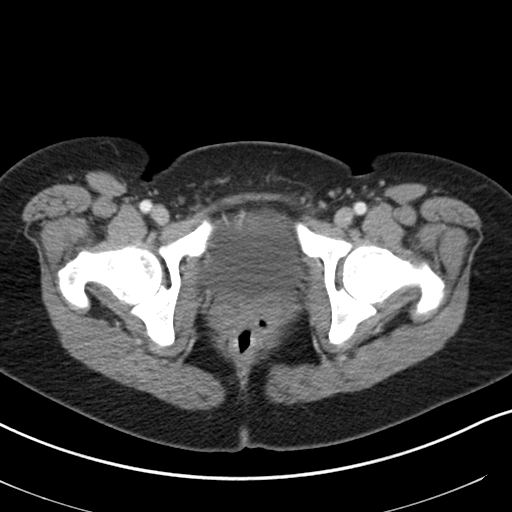
[im 17/89  soft-tissue]
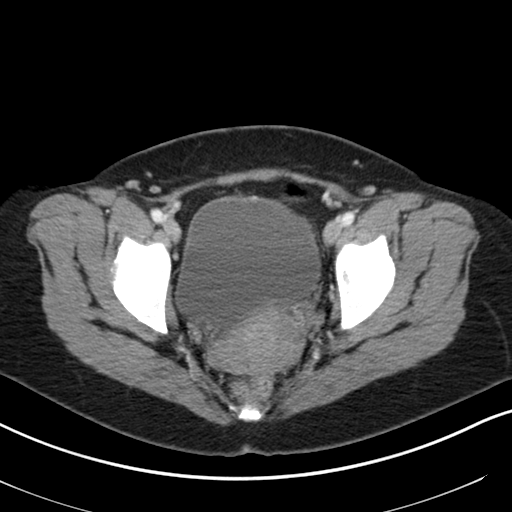
[im 28/89  soft-tissue]
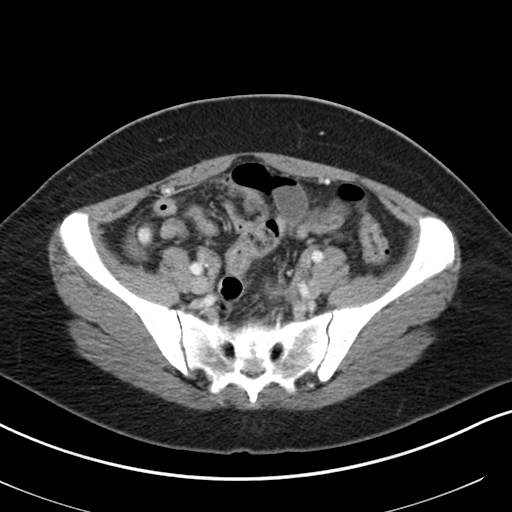
[im 34/89  soft-tissue]
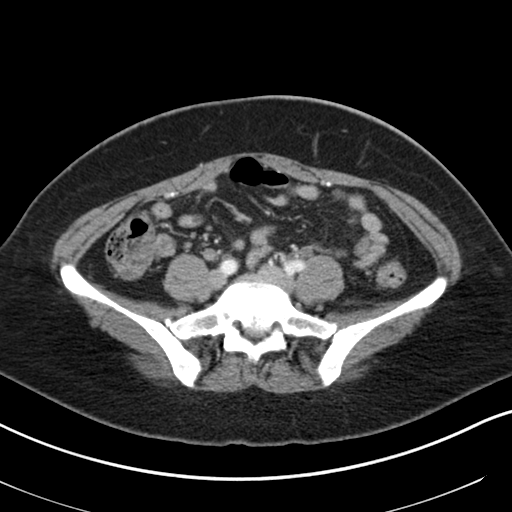
[im 39/89  soft-tissue]
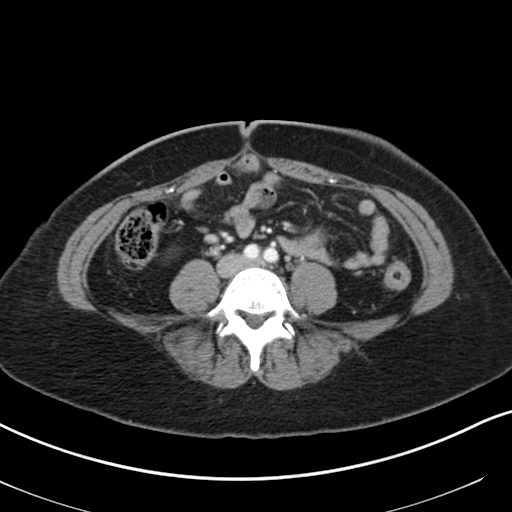
[im 45/89  soft-tissue]
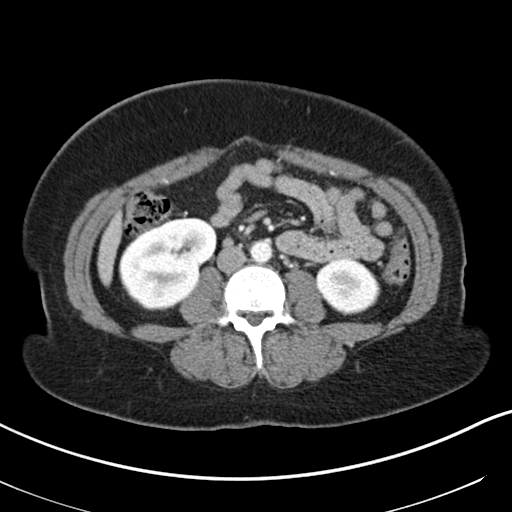
[im 50/89  soft-tissue]
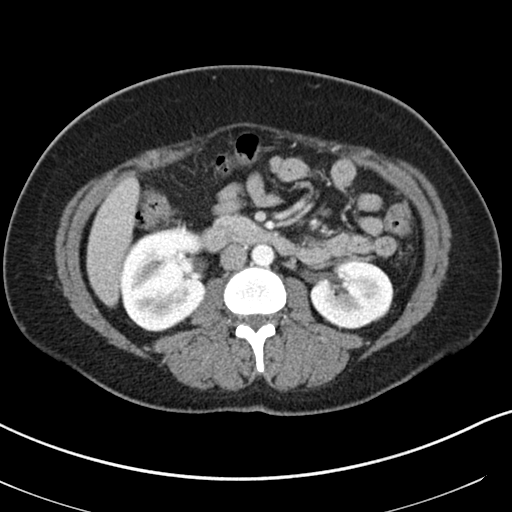
[im 56/89  soft-tissue]
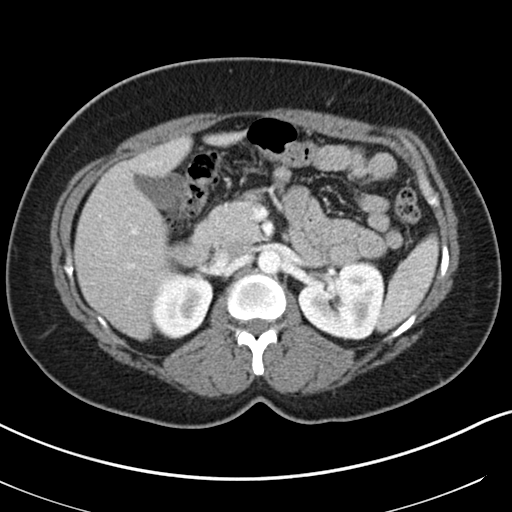
[im 56/89  bone]
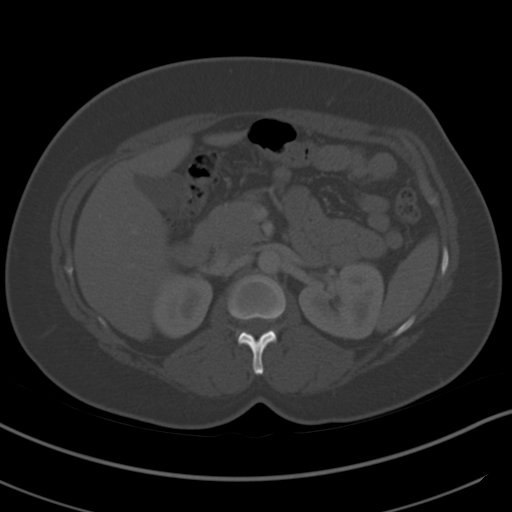
[im 61/89  soft-tissue]
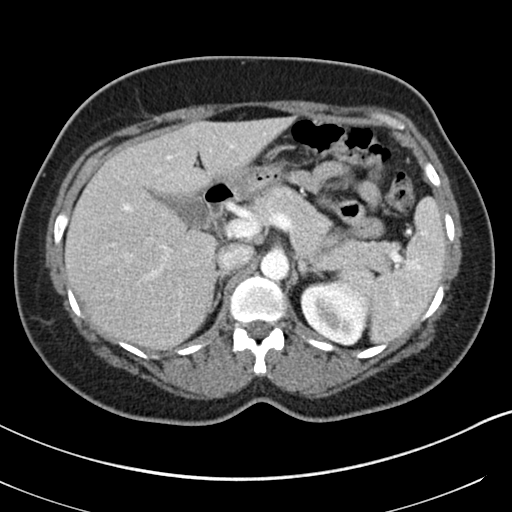
[im 72/89  soft-tissue]
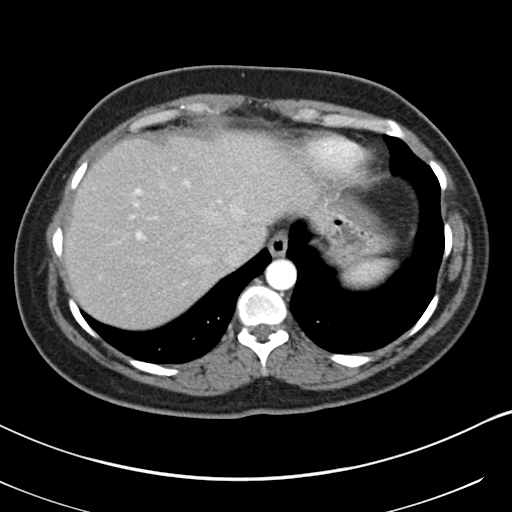
[im 78/89  soft-tissue]
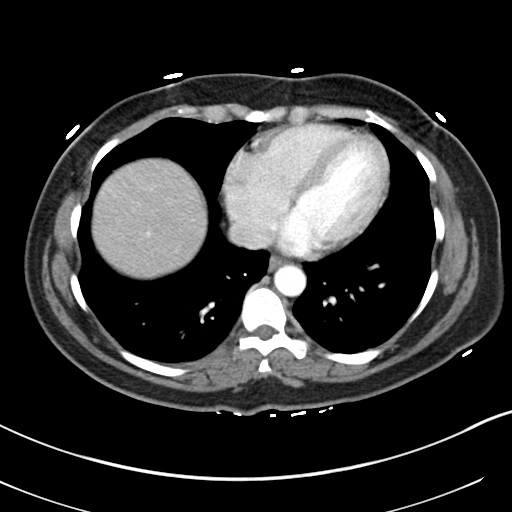
[im 83/89  soft-tissue]
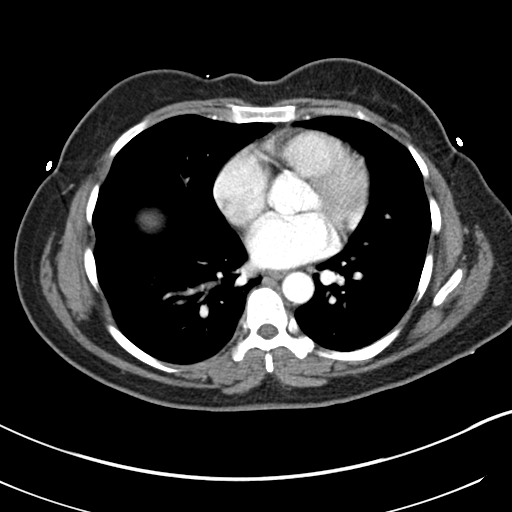

[Series 4: coronal st · coronal · 0.92mm/px · 3 of 109 slices shown]
[im 37/109  soft-tissue]
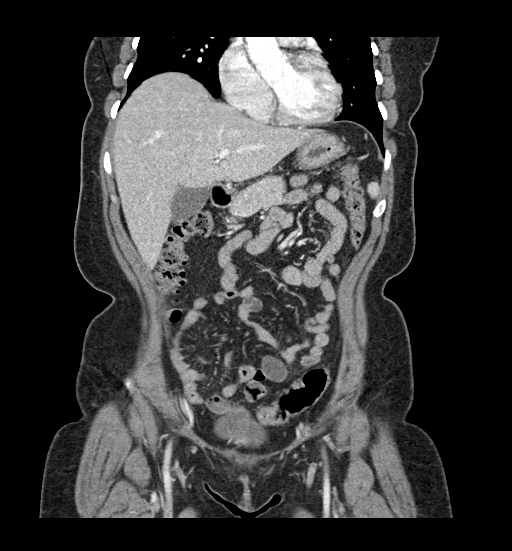
[im 49/109  soft-tissue]
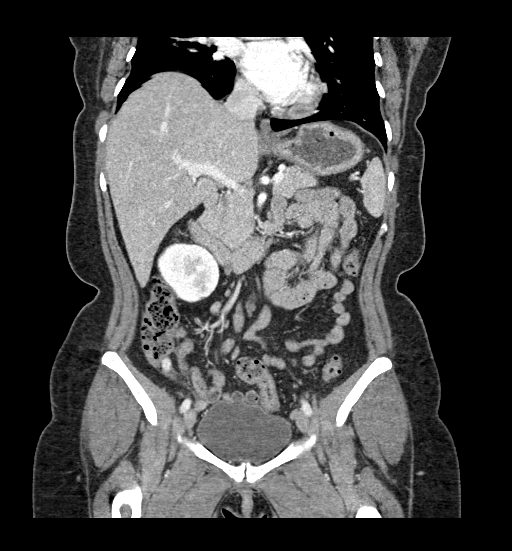
[im 61/109  soft-tissue]
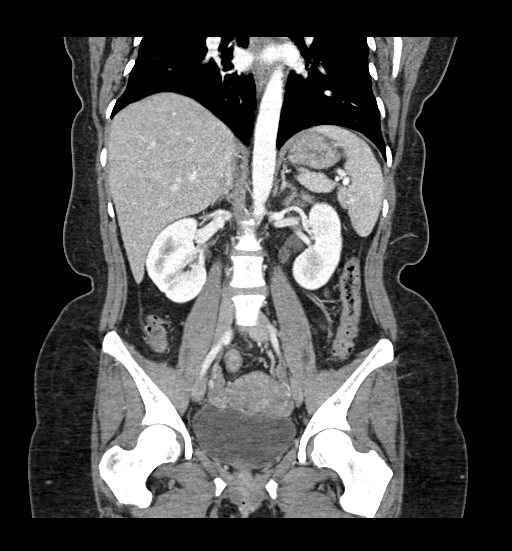

[16 of 46 positions shown; findings below may reference images not displayed]

FINDINGS: Lower chest: Bibasilar hypoventilatory changes.

Hepatobiliary: No focal liver abnormality is seen. No gallstones,
gallbladder wall thickening, or biliary dilatation.

Pancreas: Unremarkable. No pancreatic ductal dilatation or
surrounding inflammatory changes.

Spleen: Normal in size without focal abnormality.

Adrenals/Urinary Tract: Adrenal glands are unremarkable. Kidneys are
normal, without renal calculi, focal lesion, or hydronephrosis.
Bladder is unremarkable.

Stomach/Bowel: The stomach is within normal limits. There is no
evidence of bowel obstruction. The appendix is dilated measuring
cm (axial image 6, coronal image 43). There is a 9 mm appendicolith
at the base of the appendix (axial image 62, coronal image 48), and
possibly another within the mid appendix (coronal image 43).

Vascular/Lymphatic: No significant vascular findings are present. No
enlarged abdominal or pelvic lymph nodes.

Reproductive: Unremarkable.

Other: No abdominal wall hernia or abnormality. No abdominopelvic
ascites.

Musculoskeletal: No acute or significant osseous findings.
IMPRESSION: Acute appendicitis with 9mm appendicolith at the base of the
appendix. No focal fluid collection.

Appendix: Location: Pelvis

Diameter: 1.3 cm

Appendicolith: Present, 9 mm

Mucosal hyper-enhancement: Present

Extraluminal gas: None

Periappendiceal collection: None

These results were called by telephone at the time of interpretation
on 06/01/2021 at [DATE] to provider Dr. Buale Nna Bikani, who verbally
acknowledged these results.
